# Patient Record
Sex: Female | Born: 1945 | Race: White | Hispanic: No | Marital: Married | Smoking: Never smoker
Health system: Southern US, Community
[De-identification: ages and names within clinical notes are randomized; demographics above are authoritative.]

## PROBLEM LIST (undated history)

## (undated) DIAGNOSIS — Z9889 Other specified postprocedural states: Secondary | ICD-10-CM

## (undated) DIAGNOSIS — E7800 Pure hypercholesterolemia, unspecified: Secondary | ICD-10-CM

## (undated) DIAGNOSIS — I10 Essential (primary) hypertension: Secondary | ICD-10-CM

## (undated) DIAGNOSIS — R112 Nausea with vomiting, unspecified: Secondary | ICD-10-CM

## (undated) HISTORY — PX: NECK SURGERY: SHX720

## (undated) HISTORY — PX: CHOLECYSTECTOMY: SHX55

## (undated) HISTORY — DX: Pure hypercholesterolemia, unspecified: E78.00

## (undated) HISTORY — PX: OTHER SURGICAL HISTORY: SHX169

## (undated) HISTORY — DX: Essential (primary) hypertension: I10

## (undated) HISTORY — PX: BACK SURGERY: SHX140

---

## 2018-07-01 ENCOUNTER — Encounter (INDEPENDENT_AMBULATORY_CARE_PROVIDER_SITE_OTHER): Payer: Self-pay | Admitting: Internal Medicine

## 2018-07-01 ENCOUNTER — Encounter (INDEPENDENT_AMBULATORY_CARE_PROVIDER_SITE_OTHER): Payer: Self-pay | Admitting: *Deleted

## 2018-07-01 ENCOUNTER — Telehealth (INDEPENDENT_AMBULATORY_CARE_PROVIDER_SITE_OTHER): Payer: Self-pay | Admitting: *Deleted

## 2018-07-01 ENCOUNTER — Ambulatory Visit (INDEPENDENT_AMBULATORY_CARE_PROVIDER_SITE_OTHER): Payer: Medicare Other | Admitting: Internal Medicine

## 2018-07-01 VITALS — BP 118/70 | HR 78 | Temp 98.0°F | Ht 64.0 in | Wt 136.3 lb

## 2018-07-01 DIAGNOSIS — K5732 Diverticulitis of large intestine without perforation or abscess without bleeding: Secondary | ICD-10-CM | POA: Insufficient documentation

## 2018-07-01 DIAGNOSIS — Z86010 Personal history of colonic polyps: Secondary | ICD-10-CM

## 2018-07-01 MED ORDER — PEG 3350-KCL-NA BICARB-NACL 420 G PO SOLR: 4000.0000 mL | Freq: Once | ORAL | 0 refills | Status: AC

## 2018-07-01 NOTE — Progress Notes (Addendum)
   Subjective:    Patient ID: Jeanette Rasmussen, female    DOB: 05-Jul-1945, 73 y.o.   MRN: [id number]  HPI Referred by Dr. Macarthur Critchley for hx  diverticulitis. She says she is here to schedule a colonoscopy.  Underwent a sigmoidcolectomy 05/24/2016 for diverticulitis.  She says she still has flares of diverticulitis every once in a while.  Her appetite is okay. No unintentional weight. Her BMs ar normal. No melena or BRRB.  GERD which is controlled with Protonix. Takes Gaviscon at night.   Colonoscopy 2014. H/o adenomatous polyps. Dr. West Carbo Normal rectal exam. Good prep. Two small polyps in sigmoid colon- hot biopsy polypectomy. Sigmoid diverticulosis, internal hemorrhoids. Cecum well visualized. No further  Polyps from rectum to cecum. Biopsy: inflamed hyperplastic polyp.   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 High cholesterol   . Hypertension       Allergies  Allergen Reactions  . Penicillins     Rash, itching.   . Sulfa Antibiotics     Rash,itching    Current Outpatient Medications on File Prior to Visit  Medication Sig Dispense Refill  . Ascorbic Acid (VITAMIN C) 1000 MG tablet Take 1,000 mg by mouth daily.    Marland Kitchen aspirin EC 81 MG tablet Take 81 mg by mouth daily.    . Cholecalciferol (VITAMIN D3) 125 MCG (5000 UT) CAPS Take by mouth.    . Glucosamine-Chondroit-Vit C-Mn (GLUCOSAMINE 1500 COMPLEX PO) Take by mouth.    Marland Kitchen lisinopril-hydrochlorothiazide (PRINZIDE,ZESTORETIC) 10-12.5 MG tablet Take 1 tablet by mouth daily.    Marland Kitchen loratadine (CLARITIN) 10 MG tablet Take 10 mg by mouth daily.    Marland Kitchen lovastatin (MEVACOR) 20 MG tablet Take 20 mg by mouth at bedtime.    . Multiple Vitamin (MULTIVITAMIN) tablet Take 1 tablet by mouth daily.    . NON FORMULARY Chondrotin 1200mg  daily.    . Omega-3 Fatty Acids (FISH OIL) 1200 MG CAPS Take by mouth.    . pantoprazole (PROTONIX) 40 MG tablet Take 40 mg by  mouth daily.     No current facility-administered medications on file prior to visit.         Objective:   Physical Exam Blood pressure 118/70, pulse 78, temperature 98 F (36.7 C), height 5\' 4"  (1.626 m), weight 136 lb 4.8 oz (61.8 kg).  Alert and oriented. Skin warm and dry. Oral mucosa is moist.   . Sclera anicteric, conjunctivae is pink. Thyroid not enlarged. No cervical lymphadenopathy. Lungs clear. Heart regular rate and rhythm.  Abdomen is soft. Bowel sounds are positive. No hepatomegaly. No abdominal masses felt. No tenderness.  No edema to lower extremities.          Assessment & Plan:  Hx of colon polyps and diverticulitis. Needs surveillance colonoscopy The risks of bleeding, perforation and infection were reviewed with patient.

## 2018-07-01 NOTE — Telephone Encounter (Signed)
Patient needs trilyte 

## 2018-07-01 NOTE — Patient Instructions (Addendum)
The risks of bleeding, perforation and infection were reviewed with patient. Try Pepcid at night before going to bed.

## 2018-10-28 ENCOUNTER — Encounter (INDEPENDENT_AMBULATORY_CARE_PROVIDER_SITE_OTHER): Payer: Self-pay | Admitting: *Deleted

## 2018-12-17 ENCOUNTER — Other Ambulatory Visit: Payer: Self-pay

## 2018-12-17 ENCOUNTER — Other Ambulatory Visit (HOSPITAL_COMMUNITY)
Admission: RE | Admit: 2018-12-17 | Discharge: 2018-12-17 | Disposition: A | Discharge status: Home / Self Care | Payer: Medicare Other | Source: Ambulatory Visit | Attending: Internal Medicine | Admitting: Internal Medicine

## 2018-12-17 DIAGNOSIS — Z20828 Contact with and (suspected) exposure to other viral communicable diseases: Secondary | ICD-10-CM | POA: Diagnosis not present

## 2018-12-17 DIAGNOSIS — Z01812 Encounter for preprocedural laboratory examination: Secondary | ICD-10-CM | POA: Diagnosis not present

## 2018-12-17 LAB — SARS CORONAVIRUS 2 (TAT 6-24 HRS): SARS Coronavirus 2: NEGATIVE

## 2018-12-19 ENCOUNTER — Encounter (HOSPITAL_COMMUNITY)
Admission: RE | Disposition: A | Discharge status: Home / Self Care | Payer: Self-pay | Source: Home / Self Care | Attending: Internal Medicine

## 2018-12-19 ENCOUNTER — Encounter (HOSPITAL_COMMUNITY): Payer: Self-pay | Admitting: *Deleted

## 2018-12-19 ENCOUNTER — Ambulatory Visit (HOSPITAL_COMMUNITY)
Admission: RE | Admit: 2018-12-19 | Discharge: 2018-12-19 | Disposition: A | Discharge status: Home / Self Care | Payer: Medicare Other | Attending: Internal Medicine | Admitting: Internal Medicine

## 2018-12-19 ENCOUNTER — Other Ambulatory Visit: Payer: Self-pay

## 2018-12-19 DIAGNOSIS — D122 Benign neoplasm of ascending colon: Secondary | ICD-10-CM | POA: Insufficient documentation

## 2018-12-19 DIAGNOSIS — D125 Benign neoplasm of sigmoid colon: Secondary | ICD-10-CM | POA: Insufficient documentation

## 2018-12-19 DIAGNOSIS — K5730 Diverticulosis of large intestine without perforation or abscess without bleeding: Secondary | ICD-10-CM

## 2018-12-19 DIAGNOSIS — Z1211 Encounter for screening for malignant neoplasm of colon: Secondary | ICD-10-CM | POA: Insufficient documentation

## 2018-12-19 DIAGNOSIS — D120 Benign neoplasm of cecum: Secondary | ICD-10-CM

## 2018-12-19 DIAGNOSIS — I10 Essential (primary) hypertension: Secondary | ICD-10-CM | POA: Insufficient documentation

## 2018-12-19 DIAGNOSIS — Z09 Encounter for follow-up examination after completed treatment for conditions other than malignant neoplasm: Secondary | ICD-10-CM | POA: Diagnosis not present

## 2018-12-19 DIAGNOSIS — Z79899 Other long term (current) drug therapy: Secondary | ICD-10-CM | POA: Insufficient documentation

## 2018-12-19 DIAGNOSIS — K644 Residual hemorrhoidal skin tags: Secondary | ICD-10-CM | POA: Insufficient documentation

## 2018-12-19 DIAGNOSIS — K6289 Other specified diseases of anus and rectum: Secondary | ICD-10-CM | POA: Diagnosis not present

## 2018-12-19 DIAGNOSIS — E7800 Pure hypercholesterolemia, unspecified: Secondary | ICD-10-CM | POA: Insufficient documentation

## 2018-12-19 DIAGNOSIS — Z86010 Personal history of colonic polyps: Secondary | ICD-10-CM

## 2018-12-19 DIAGNOSIS — Z7982 Long term (current) use of aspirin: Secondary | ICD-10-CM | POA: Diagnosis not present

## 2018-12-19 DIAGNOSIS — Z980 Intestinal bypass and anastomosis status: Secondary | ICD-10-CM | POA: Diagnosis not present

## 2018-12-19 DIAGNOSIS — K5732 Diverticulitis of large intestine without perforation or abscess without bleeding: Secondary | ICD-10-CM

## 2018-12-19 HISTORY — PX: COLONOSCOPY: SHX5424

## 2018-12-19 HISTORY — PX: BIOPSY: SHX5522

## 2018-12-19 SURGERY — COLONOSCOPY
Anesthesia: Moderate Sedation

## 2018-12-19 MED ORDER — MIDAZOLAM HCL 5 MG/5ML IJ SOLN
INTRAMUSCULAR | Status: DC | PRN
  Administered 2018-12-19: 1 mg via INTRAVENOUS
  Administered 2018-12-19 (×2): 2 mg via INTRAVENOUS
  Administered 2018-12-19: 1 mg via INTRAVENOUS

## 2018-12-19 MED ORDER — SODIUM CHLORIDE 0.9 % IV SOLN
INTRAVENOUS | Status: DC
  Administered 2018-12-19: 09:00:00 via INTRAVENOUS

## 2018-12-19 MED ORDER — MIDAZOLAM HCL 5 MG/5ML IJ SOLN
INTRAMUSCULAR | Status: AC
  Filled 2018-12-19: qty 10

## 2018-12-19 MED ORDER — STERILE WATER FOR IRRIGATION IR SOLN
Status: DC | PRN
  Administered 2018-12-19: 5 mL

## 2018-12-19 MED ORDER — MEPERIDINE HCL 50 MG/ML IJ SOLN
INTRAMUSCULAR | Status: DC | PRN
  Administered 2018-12-19 (×2): 25 mg

## 2018-12-19 MED ORDER — MEPERIDINE HCL 50 MG/ML IJ SOLN
INTRAMUSCULAR | Status: AC
  Filled 2018-12-19: qty 1

## 2018-12-19 NOTE — H&P (Signed)
Jeanette Rasmussen is an 73 y.o. female.   Chief Complaint: Patient is here for colonoscopy. HPI: Patient is 73 year old Caucasian female who has history of colonic adenomas and is here for surveillance colonoscopy.  Last colonoscopy was about 6 years ago.  She denies abdominal pain or rectal bleeding.  She is prone to constipation.  She also has history of recurrent diverticulitis and underwent sigmoid colon resection in January 2018.  She says she is to flareup since then responding to outpatient antibiotic therapy. Family history significant for CRC in 1 first cousin who was in his 41s at the time of diagnosis.  He is doing fine 15 years later.  Past Medical History:  Diagnosis Date  . High cholesterol   . Hypertension     Past Surgical History:  Procedure Laterality Date  . BACK SURGERY     x 2  . CHOLECYSTECTOMY    . complete hysterectomy    . hemicolectdomy     2018 Dr. Audrie Lia  . Left hernia      inguinal (Left)  . NECK SURGERY     x 3    History reviewed. No pertinent family history. Social History:  reports that she has never smoked. She has never used smokeless tobacco. She reports that she does not drink alcohol or use drugs.  Allergies:  Allergies  Allergen Reactions  . Penicillins     Rash, itching.  Did it involve swelling of the face/tongue/throat, SOB, or low BP? No Did it involve sudden or severe rash/hives, skin peeling, or any reaction on the inside of your mouth or nose? No Did you need to seek medical attention at a hospital or doctor's office? No When did it last happen?its been years  If all above answers are "NO", may proceed with cephalosporin use.   . Sulfa Antibiotics     Rash,itching    Medications Prior to Admission  Medication Sig Dispense Refill  . Ascorbic Acid (VITAMIN C) 1000 MG tablet Take 1,000 mg by mouth daily.    Marland Kitchen aspirin EC 81 MG tablet Take 81 mg by mouth daily.    . Cholecalciferol (VITAMIN D3) 125 MCG (5000 UT) CAPS Take  5,000 Units by mouth daily.     . Glucosamine-Chondroit-Vit C-Mn (GLUCOSAMINE 1500 COMPLEX PO) Take 1 tablet by mouth daily.     Marland Kitchen lisinopril-hydrochlorothiazide (PRINZIDE,ZESTORETIC) 10-12.5 MG tablet Take 1 tablet by mouth daily.    Marland Kitchen loratadine (CLARITIN) 10 MG tablet Take 10 mg by mouth daily as needed for allergies.     Marland Kitchen lovastatin (MEVACOR) 20 MG tablet Take 20 mg by mouth at bedtime.    . Multiple Vitamin (MULTIVITAMIN) tablet Take 1 tablet by mouth daily.    . Omega-3 Fatty Acids (FISH OIL) 1200 MG CAPS Take 1,200 mg by mouth daily.     . pantoprazole (PROTONIX) 40 MG tablet Take 40 mg by mouth daily.      No results found for this or any previous visit (from the past 48 hour(s)). No results found.  ROS  Blood pressure (!) 148/88, pulse 66, temperature 98.3 F (36.8 C), temperature source Oral, resp. rate 16, SpO2 99 %. Physical Exam  Constitutional: She appears well-developed and well-nourished.  HENT:  Mouth/Throat: Oropharynx is clear and moist.  Eyes: Conjunctivae are normal. No scleral icterus.  Neck: No thyromegaly present.  Cardiovascular: Normal rate, regular rhythm and normal heart sounds.  No murmur heard. Respiratory: Effort normal and breath sounds normal.  GI:  Abdomen is  flat with scarring left lower quadrant.  Abdomen is soft and nontender with organomegaly or masses.  Musculoskeletal:        General: No edema.  Lymphadenopathy:    She has no cervical adenopathy.  Neurological: She is alert.  Skin: Skin is warm and dry.     Assessment/Plan History of colonic adenomas. Surveillance colonoscopy.  Hildred Laser, MD 12/19/2018, 9:15 AM

## 2018-12-19 NOTE — Discharge Instructions (Signed)
No aspirin or NSAIDs for 24 hours. Resume other medications as before. High-fiber diet. May consider Metamucil 4 g by mouth daily at bedtime. No driving for 24 hours. Physician will call with biopsy results.   Colonoscopy, Adult, Care After This sheet gives you information about how to care for yourself after your procedure. Your health care provider may also give you more specific instructions. If you have problems or questions, contact your health care provider.  Dr Laural GoldenWB:[phone].  After hours and weekends call the hospital and have the GI doctor on call paged.  They will call you back. What can I expect after the procedure? After the procedure, it is common to have:  A small amount of blood in your stool for 24 hours after the procedure.  Some gas.  Mild abdominal cramping or bloating.   Follow these instructions at home: General instructions  For the first 24 hours after the procedure: ? Do not drive or use machinery. ? Do not sign important documents. ? Do not drink alcohol. ? Do your regular daily activities at a slower pace than normal.  Take over-the-counter or prescription medicines only as told by your health care provider.   Relieving cramping and bloating   Try walking around when you have cramps or feel bloated.  Eating and drinking   Drink enough fluid to keep your urine pale yellow.  Resume your normal diet as instructed by your health care provider.  Avoid drinking alcohol for as long as instructed by your health care provider.  Contact a health care provider if:  You have blood in your stool 2-3 days after the procedure. Get help right away if:  You have more than a small spotting of blood in your stool.  You pass large blood clots in your stool.  Your abdomen is swollen.  You have nausea or vomiting.  You have a fever.  You have increasing abdominal pain that is not relieved with medicine. Summary  After the procedure, it is  common to have a small amount of blood in your stool. You may also have mild abdominal cramping and bloating.  For the first 24 hours after the procedure, do not drive or use machinery, sign important documents, or drink alcohol.  Contact your health care provider if you have a lot of blood in your stool, nausea or vomiting, a fever, or increased abdominal pain. This information is not intended to replace advice given to you by your health care provider. Make sure you discuss any questions you have with your health care provider. Document Released: 11/23/2003 Document Revised: 01/31/2017 Document Reviewed: 06/22/2015 Elsevier Patient Education  [place].    High-Fiber Diet Fiber, also called dietary fiber, is a type of carbohydrate that is found in fruits, vegetables, whole grains, and beans. A high-fiber diet can have many health benefits. Your health care provider may recommend a high-fiber diet to help:  Prevent constipation. Fiber can make your bowel movements more regular.  Lower your cholesterol.  Relieve the following conditions: ? Swelling of veins in the anus (hemorrhoids). ? Swelling and irritation (inflammation) of specific areas of the digestive tract (uncomplicated diverticulosis). ? A problem of the large intestine (colon) that sometimes causes pain and diarrhea (irritable bowel syndrome, IBS).  Prevent overeating as part of a weight-loss plan.  Prevent heart disease, type 2 diabetes, and certain cancers. What is my plan? The recommended daily fiber intake in grams (g) includes:  38 g for men age 34  or younger.  30 g for men over age 107.  57 g for women age 60 or younger.  21 g for women over age 30. You can get the recommended daily intake of dietary fiber by:  Eating a variety of fruits, vegetables, grains, and beans.  Taking a fiber supplement, if it is not possible to get enough fiber through your diet. What do I need to know about a high-fiber  diet?  It is better to get fiber through food sources rather than from fiber supplements. There is not a lot of research about how effective supplements are.  Always check the fiber content on the nutrition facts label of any prepackaged food. Look for foods that contain 5 g of fiber or more per serving.  Talk with a diet and nutrition specialist (dietitian) if you have questions about specific foods that are recommended or not recommended for your medical condition, especially if those foods are not listed below.  Gradually increase how much fiber you consume. If you increase your intake of dietary fiber too quickly, you may have bloating, cramping, or gas.  Drink plenty of water. Water helps you to digest fiber. What are tips for following this plan?  Eat a wide variety of high-fiber foods.  Make sure that half of the grains that you eat each day are whole grains.  Eat breads and cereals that are made with whole-grain flour instead of refined flour or white flour.  Eat brown rice, bulgur wheat, or millet instead of white rice.  Start the day with a breakfast that is high in fiber, such as a cereal that contains 5 g of fiber or more per serving.  Use beans in place of meat in soups, salads, and pasta dishes.  Eat high-fiber snacks, such as berries, raw vegetables, nuts, and popcorn.  Choose whole fruits and vegetables instead of processed forms like juice or sauce. What foods can I eat?  Fruits Berries. Pears. Apples. Oranges. Avocado. Prunes and raisins. Dried figs. Vegetables Sweet potatoes. Spinach. Kale. Artichokes. Cabbage. Broccoli. Cauliflower. Green peas. Carrots. Squash. Grains Whole-grain breads. Multigrain cereal. Oats and oatmeal. Brown rice. Barley. Bulgur wheat. [place]. Quinoa. Bran muffins. Popcorn. Rye wafer crackers. Meats and other proteins Navy, kidney, and pinto beans. Soybeans. Split peas. Lentils. Nuts and seeds. Dairy Fiber-fortified  yogurt. Beverages Fiber-fortified soy milk. Fiber-fortified orange juice. Other foods Fiber bars. The items listed above may not be a complete list of recommended foods and beverages. Contact a dietitian for more options. What foods are not recommended? Fruits Fruit juice. Cooked, strained fruit. Vegetables Fried potatoes. Canned vegetables. Well-cooked vegetables. Grains White bread. Pasta made with refined flour. White rice. Meats and other proteins Fatty cuts of meat. Fried chicken or fried fish. Dairy Milk. Yogurt. Cream cheese. Sour cream. Fats and oils Butters. Beverages Soft drinks. Other foods Cakes and pastries. The items listed above may not be a complete list of foods and beverages to avoid. Contact a dietitian for more information. Summary  Fiber is a type of carbohydrate. It is found in fruits, vegetables, whole grains, and beans.  There are many health benefits of eating a high-fiber diet, such as preventing constipation, lowering blood cholesterol, helping with weight loss, and reducing your risk of heart disease, diabetes, and certain cancers.  Gradually increase your intake of fiber. Increasing too fast can result in cramping, bloating, and gas. Drink plenty of water while you increase your fiber.  The best sources of fiber include whole fruits and vegetables,  whole grains, nuts, seeds, and beans. This information is not intended to replace advice given to you by your health care provider. Make sure you discuss any questions you have with your health care provider. Document Released: 04/10/2005 Document Revised: 02/12/2017 Document Reviewed: 02/12/2017 Elsevier Patient Education  2020 [employer].

## 2018-12-19 NOTE — Op Note (Signed)
[facility] Patient Name: Jeanette Rasmussen Procedure Date: 12/19/2018 9:10 AM MRN: [id number] Date of Birth: 1946/04/16 Attending MD: Hildred Laser , MD CSN: [id number] Age: 73 Admit Type: Outpatient Procedure:                Colonoscopy Indications:              High risk colon cancer surveillance: Personal                            history of colonic polyps Providers:                Hildred Laser, MD, Janeece Riggers, RN, Raphael Gibney,                            Technician Referring MD:             Earney Mallet, MD Medicines:                Meperidine 50 mg IV, Midazolam 6 mg IV Complications:            No immediate complications. Estimated Blood Loss:     Estimated blood loss was minimal. Procedure:                Pre-Anesthesia Assessment:                           - Prior to the procedure, a History and Physical                            was performed, and patient medications and                            allergies were reviewed. The patient's tolerance of                            previous anesthesia was also reviewed. The risks                            and benefits of the procedure and the sedation                            options and risks were discussed with the patient.                            All questions were answered, and informed consent                            was obtained. Prior Anticoagulants: The patient has                            taken no previous anticoagulant or antiplatelet                            agents except for aspirin. ASA Grade Assessment: II                            -  A patient with mild systemic disease. After                            reviewing the risks and benefits, the patient was                            deemed in satisfactory condition to undergo the                            procedure.                           After obtaining informed consent, the colonoscope                            was passed under direct  vision. Throughout the                            procedure, the patient's blood pressure, pulse, and                            oxygen saturations were monitored continuously. The                            [id number]) scope was introduced through                            the anus and advanced to the the cecum, identified                            by appendiceal orifice and ileocecal valve. The                            colonoscopy was performed without difficulty. The                            patient tolerated the procedure well. The quality                            of the bowel preparation was excellent. The                            ileocecal valve, appendiceal orifice, and rectum                            were photographed. Scope In: 9:25:14 AM Scope Out: 9:44:54 AM Scope Withdrawal Time: 0 hours 14 minutes 18 seconds  Total Procedure Duration: 0 hours 19 minutes 40 seconds  Findings:      The perianal and digital rectal examinations were normal.      Three sessile polyps were found in the proximal sigmoid colon, ascending       colon and cecum. The polyps were small in size. These were biopsied with       a cold forceps for histology. The pathology specimen was placed into  Bottle Number 1.      A few diverticula were found in the proximal sigmoid colon and hepatic       flexure.      There was evidence of a prior end-to-end colo-rectal anastomosis at 18       cm proximal to the anus. This was patent and was characterized by       healthy appearing mucosa.      External hemorrhoids were found during retroflexion. The hemorrhoids       were small.      Anal papilla(e) were hypertrophied. Impression:               - Three small polyps in the proximal sigmoid colon,                            in the ascending colon and in the cecum. Biopsied.                           - Diverticulosis in the proximal sigmoid colon and                            at the  hepatic flexure.                           - Patent end-to-end colo-rectal anastomosis,                            characterized by healthy appearing mucosa.                           - External hemorrhoids.                           - Anal papilla(e) were hypertrophied. Moderate Sedation:      Moderate (conscious) sedation was administered by the endoscopy nurse       and supervised by the endoscopist. The following parameters were       monitored: oxygen saturation, heart rate, blood pressure, CO2       capnography and response to care. Total physician intraservice time was       29 minutes. Recommendation:           - Patient has a contact number available for                            emergencies. The signs and symptoms of potential                            delayed complications were discussed with the                            patient. Return to normal activities tomorrow.                            Written discharge instructions were provided to the                            patient.                           -  High fiber diet today.                           - Continue present medications.                           - No aspirin, ibuprofen, naproxen, or other                            non-steroidal anti-inflammatory drugs for 1 day.                           - Await pathology results.                           - Repeat colonoscopy is recommended. The                            colonoscopy date will be determined after pathology                            results from today's exam become available for                            review. Procedure Code(s):        --- Professional ---                           [phone], Colonoscopy, flexible; with biopsy, single                            or multiple                           99153, Moderate sedation; each additional 15                            minutes intraservice time                           G0500, Moderate sedation services  provided by the                            same physician or other qualified health care                            professional performing a gastrointestinal                            endoscopic service that sedation supports,                            requiring the presence of an independent trained                            observer to assist in the monitoring of the  patient's level of consciousness and physiological                            status; initial 15 minutes of intra-service time;                            patient age 71 years or older (additional time may                            be reported with [phone], as appropriate) Diagnosis Code(s):        --- Professional ---                           Z86.010, Personal history of colonic polyps                           K63.5, Polyp of colon                           K64.4, Residual hemorrhoidal skin tags                           Z98.0, Intestinal bypass and anastomosis status                           K62.89, Other specified diseases of anus and rectum                           K57.30, Diverticulosis of large intestine without                            perforation or abscess without bleeding CPT copyright 2019 American Medical Association. All rights reserved. The codes documented in this report are preliminary and upon coder review may  be revised to meet current compliance requirements. Hildred Laser, MD Hildred Laser, MD 12/19/2018 9:58:06 AM This report has been signed electronically. Number of Addenda: 0

## 2018-12-23 ENCOUNTER — Encounter (HOSPITAL_COMMUNITY): Payer: Self-pay | Admitting: Internal Medicine

## 2019-07-22 ENCOUNTER — Encounter (INDEPENDENT_AMBULATORY_CARE_PROVIDER_SITE_OTHER): Payer: Self-pay | Admitting: Internal Medicine

## 2019-07-22 ENCOUNTER — Other Ambulatory Visit: Payer: Self-pay

## 2019-07-22 ENCOUNTER — Ambulatory Visit (INDEPENDENT_AMBULATORY_CARE_PROVIDER_SITE_OTHER): Payer: Medicare Other | Admitting: Internal Medicine

## 2019-07-22 DIAGNOSIS — K219 Gastro-esophageal reflux disease without esophagitis: Secondary | ICD-10-CM | POA: Insufficient documentation

## 2019-07-22 DIAGNOSIS — R1013 Epigastric pain: Secondary | ICD-10-CM | POA: Insufficient documentation

## 2019-07-22 MED ORDER — ESOMEPRAZOLE MAGNESIUM 40 MG PO CPDR: 40.0000 mg | DELAYED_RELEASE_CAPSULE | Freq: Every day | ORAL | 5 refills | Status: DC

## 2019-07-22 NOTE — Patient Instructions (Signed)
Discontinue pantoprazole. Begin Nexium/esomeprazole 40 mg by mouth 30 minutes before breakfast daily. Will request copy of recent blood work and prior EGD reports were reviewed.

## 2019-07-22 NOTE — Progress Notes (Signed)
Presenting complaint;  Persistent symptoms of GERD despite taking medications.  Database and subjective:  Patient is 74 year old Caucasian female who presents for evaluation of GERD symptoms not responding to therapy anymore. She has history of diverticulitis with prior sigmoid colon resection as well as history of colonic polyps.  Her last colonoscopy was in August 2020 revealing 3 small polyps which were tubular adenomas and she also had few diverticula proximal to colonic anastomosis as well as at hepatic flexure.  Patient gives over 20-year history of GERD.  She reports last EGD by Dr. West Carbo was in 2014 and if she remembers correctly she had small sliding hiatal hernia.  She states she has had 2 or 3 EGDs prior to that as well.  She has tried Prilosec in the past but it did not work.  She has been on Protonix for several years and it worked very well until few months ago.  Dr. Macarthur Critchley advised her to take double dose which she did for 2 weeks and did not see any difference.  She has daily heartburn particularly if she stoops forward.  She eats her supper between 5 and 5:30 PM and goes to bed at 10:30 PM.  She has heartburn or regurgitation virtually every night.  She wakes up in the middle of night with burning in her chest.  She has elevated head end of bed.  She denies nausea vomiting or dysphagia.  She says when she was doing well she would take the medication and wait a while before she would eat breakfast and now she has to eat her breakfast as soon as she wakes up.  If she does not she gets heartburn and nausea.  She also complains of lower abdominal pain which she has had for years since she has had problems with diverticulitis.  In addition to this pain she also complains of epigastric pain which she has had off-and-on for the past 6 months and is more pronounced after meals.  She does not take any NSAIDs.  She has never been diagnosed with peptic ulcer disease.  She is watching her diet.   She may have 1 can of Coke and 1 cup of coffee a day. Her bowels move daily as long as she takes polyethylene glycol.  She denies melena or rectal bleeding.  She does not smoke cigarettes and drinks wine occasionally. She takes Gaviscon at bedtime which is not listed below.  She states she tried famotidine but it did not work.  She feels Gaviscon has helped.  Current Medications: Outpatient Encounter Medications as of 07/22/2019  Medication Sig  . Ascorbic Acid (VITAMIN C) 1000 MG tablet Take 1,000 mg by mouth daily.  Marland Kitchen aspirin EC 81 MG tablet Take 1 tablet (81 mg total) by mouth daily.  . Cholecalciferol (VITAMIN D3) 125 MCG (5000 UT) CAPS Take 5,000 Units by mouth daily.   . Glucosamine-Chondroit-Vit C-Mn (GLUCOSAMINE 1500 COMPLEX PO) Take 1 tablet by mouth daily.   Marland Kitchen lisinopril-hydrochlorothiazide (PRINZIDE,ZESTORETIC) 10-12.5 MG tablet Take 1 tablet by mouth daily.  Marland Kitchen loratadine (CLARITIN) 10 MG tablet Take 10 mg by mouth daily as needed for allergies.   Marland Kitchen lovastatin (MEVACOR) 20 MG tablet Take 20 mg by mouth at bedtime.  . Multiple Vitamin (MULTIVITAMIN) tablet Take 1 tablet by mouth daily.  . Omega-3 Fatty Acids (FISH OIL) 1200 MG CAPS Take 1,200 mg by mouth daily.   . pantoprazole (PROTONIX) 40 MG tablet Take 40 mg by mouth daily.  . polyethylene glycol (MIRALAX /  GLYCOLAX) 17 g packet Take 17 g by mouth daily. Patient states that she takes 1 teaspoon daily.   No facility-administered encounter medications on file as of 07/22/2019.    Objective: Blood pressure 133/86, pulse 60, temperature (!) 97.2 F (36.2 C), temperature source Temporal, height 5\' 4"  (1.626 m), weight 134 lb 4.8 oz (60.9 kg). Patient is alert and in no acute distress. She is wearing a facial mask. Conjunctiva is pink. Sclera is nonicteric Oropharyngeal mucosa is normal. No neck masses or thyromegaly noted. She has 2 neck scars anteriorly to the right of midline lower scar is just above medial end of  clavicle. She also has a long midline posterior scar over lower neck. Cardiac exam with regular rhythm normal S1 and S2. No murmur or gallop noted. Lungs are clear to auscultation. Abdomen is symmetrical.  Bowel sounds are normal.  She has lower midline and Pfannenstiel scars.  On palpation abdomen is soft.  She has mild midepigastric tenderness.  No organomegaly or masses. No LE edema or clubbing noted.  Labs/studies Results: No recent lab data reviewed.  Assessment:  #1.  Chronic GERD.  She has not changed her eating habits and not taking any new medications.  Pantoprazole is not working anymore.  It is possible that she just has developed tachyphylaxis.  She has undergone multiple EGDs in the past and never documented to have Barrett's esophagus.  She has history of small sliding hiatal hernia and one has to wonder if hernia has gotten large or if she has developed other conditions such as peptic ulcer disease or gastroparesis.  She is status post cholecystectomy.  #2.  Epigastric pain.  No history of peptic ulcer disease.  She may need an EGD.  #3.  History of colonic adenomas.  She will not be due for colonoscopy until August 2025.   Plan:  Discontinue pantoprazole. Continue Gaviscon at bedtime as before. Esomeprazole 40 mg by mouth 30 minutes before breakfast daily. Will request copy of blood work from Dr. Karna Christmas office before any tests ordered. Office visit in 3 months.

## 2019-07-29 ENCOUNTER — Other Ambulatory Visit (INDEPENDENT_AMBULATORY_CARE_PROVIDER_SITE_OTHER): Payer: Self-pay | Admitting: *Deleted

## 2019-07-29 ENCOUNTER — Encounter (INDEPENDENT_AMBULATORY_CARE_PROVIDER_SITE_OTHER): Payer: Self-pay | Admitting: *Deleted

## 2019-07-29 DIAGNOSIS — R1013 Epigastric pain: Secondary | ICD-10-CM

## 2019-07-29 DIAGNOSIS — K219 Gastro-esophageal reflux disease without esophagitis: Secondary | ICD-10-CM

## 2019-08-25 ENCOUNTER — Other Ambulatory Visit: Payer: Self-pay

## 2019-08-25 ENCOUNTER — Other Ambulatory Visit (HOSPITAL_COMMUNITY)
Admission: RE | Admit: 2019-08-25 | Discharge: 2019-08-25 | Disposition: A | Discharge status: Home / Self Care | Payer: Medicare Other | Source: Ambulatory Visit | Attending: Internal Medicine | Admitting: Internal Medicine

## 2019-08-25 DIAGNOSIS — Z20822 Contact with and (suspected) exposure to covid-19: Secondary | ICD-10-CM | POA: Insufficient documentation

## 2019-08-25 DIAGNOSIS — Z01812 Encounter for preprocedural laboratory examination: Secondary | ICD-10-CM | POA: Diagnosis present

## 2019-08-25 LAB — SARS CORONAVIRUS 2 (TAT 6-24 HRS): SARS Coronavirus 2: NEGATIVE

## 2019-08-27 ENCOUNTER — Ambulatory Visit (HOSPITAL_COMMUNITY)
Admission: RE | Admit: 2019-08-27 | Discharge: 2019-08-27 | Disposition: A | Discharge status: Home / Self Care | Payer: Medicare Other | Attending: Internal Medicine | Admitting: Internal Medicine

## 2019-08-27 ENCOUNTER — Other Ambulatory Visit: Payer: Self-pay

## 2019-08-27 ENCOUNTER — Encounter (HOSPITAL_COMMUNITY)
Admission: RE | Disposition: A | Discharge status: Home / Self Care | Payer: Self-pay | Source: Home / Self Care | Attending: Internal Medicine

## 2019-08-27 ENCOUNTER — Encounter (HOSPITAL_COMMUNITY): Payer: Self-pay | Admitting: Internal Medicine

## 2019-08-27 DIAGNOSIS — K317 Polyp of stomach and duodenum: Secondary | ICD-10-CM

## 2019-08-27 DIAGNOSIS — Z882 Allergy status to sulfonamides status: Secondary | ICD-10-CM | POA: Diagnosis not present

## 2019-08-27 DIAGNOSIS — K219 Gastro-esophageal reflux disease without esophagitis: Secondary | ICD-10-CM

## 2019-08-27 DIAGNOSIS — K2970 Gastritis, unspecified, without bleeding: Secondary | ICD-10-CM | POA: Insufficient documentation

## 2019-08-27 DIAGNOSIS — I10 Essential (primary) hypertension: Secondary | ICD-10-CM | POA: Diagnosis not present

## 2019-08-27 DIAGNOSIS — Z800 Family history of malignant neoplasm of digestive organs: Secondary | ICD-10-CM | POA: Insufficient documentation

## 2019-08-27 DIAGNOSIS — Z7982 Long term (current) use of aspirin: Secondary | ICD-10-CM | POA: Insufficient documentation

## 2019-08-27 DIAGNOSIS — Z880 Allergy status to penicillin: Secondary | ICD-10-CM | POA: Diagnosis not present

## 2019-08-27 DIAGNOSIS — K3189 Other diseases of stomach and duodenum: Secondary | ICD-10-CM

## 2019-08-27 DIAGNOSIS — Z79899 Other long term (current) drug therapy: Secondary | ICD-10-CM | POA: Diagnosis not present

## 2019-08-27 DIAGNOSIS — R1013 Epigastric pain: Secondary | ICD-10-CM | POA: Diagnosis not present

## 2019-08-27 DIAGNOSIS — Z90710 Acquired absence of both cervix and uterus: Secondary | ICD-10-CM | POA: Diagnosis not present

## 2019-08-27 DIAGNOSIS — Z9049 Acquired absence of other specified parts of digestive tract: Secondary | ICD-10-CM | POA: Diagnosis not present

## 2019-08-27 DIAGNOSIS — Z86010 Personal history of colonic polyps: Secondary | ICD-10-CM | POA: Insufficient documentation

## 2019-08-27 DIAGNOSIS — E7800 Pure hypercholesterolemia, unspecified: Secondary | ICD-10-CM | POA: Insufficient documentation

## 2019-08-27 DIAGNOSIS — K449 Diaphragmatic hernia without obstruction or gangrene: Secondary | ICD-10-CM | POA: Diagnosis not present

## 2019-08-27 HISTORY — PX: BIOPSY: SHX5522

## 2019-08-27 HISTORY — PX: POLYPECTOMY: SHX5525

## 2019-08-27 HISTORY — PX: ESOPHAGOGASTRODUODENOSCOPY: SHX5428

## 2019-08-27 HISTORY — DX: Other specified postprocedural states: Z98.890

## 2019-08-27 HISTORY — DX: Nausea with vomiting, unspecified: R11.2

## 2019-08-27 SURGERY — EGD (ESOPHAGOGASTRODUODENOSCOPY)
Anesthesia: Moderate Sedation

## 2019-08-27 MED ORDER — STERILE WATER FOR IRRIGATION IR SOLN
Status: DC | PRN
  Administered 2019-08-27: 1.5 mL

## 2019-08-27 MED ORDER — ONDANSETRON 4 MG PO TBDP
4.0000 mg | ORAL_TABLET | Freq: Once | ORAL | Status: AC
  Administered 2019-08-27: 4 mg via ORAL

## 2019-08-27 MED ORDER — SODIUM CHLORIDE 0.9 % IV SOLN
INTRAVENOUS | Status: DC
  Administered 2019-08-27: 1000 mL via INTRAVENOUS

## 2019-08-27 MED ORDER — ONDANSETRON 4 MG PO TBDP
ORAL_TABLET | ORAL | Status: AC
  Filled 2019-08-27: qty 1

## 2019-08-27 MED ORDER — MEPERIDINE HCL 50 MG/ML IJ SOLN
INTRAMUSCULAR | Status: DC | PRN
  Administered 2019-08-27 (×2): 25 mg via INTRAVENOUS

## 2019-08-27 MED ORDER — LIDOCAINE VISCOUS HCL 2 % MT SOLN
OROMUCOSAL | Status: DC | PRN
  Administered 2019-08-27: 4 mL via OROMUCOSAL

## 2019-08-27 MED ORDER — LIDOCAINE VISCOUS HCL 2 % MT SOLN
OROMUCOSAL | Status: AC
  Filled 2019-08-27: qty 15

## 2019-08-27 MED ORDER — MEPERIDINE HCL 50 MG/ML IJ SOLN
INTRAMUSCULAR | Status: AC
  Filled 2019-08-27: qty 1

## 2019-08-27 MED ORDER — MIDAZOLAM HCL 5 MG/5ML IJ SOLN
INTRAMUSCULAR | Status: DC | PRN
  Administered 2019-08-27 (×2): 2 mg via INTRAVENOUS
  Administered 2019-08-27: 1 mg via INTRAVENOUS

## 2019-08-27 MED ORDER — MIDAZOLAM HCL 5 MG/5ML IJ SOLN
INTRAMUSCULAR | Status: AC
  Filled 2019-08-27: qty 10

## 2019-08-27 NOTE — H&P (Signed)
Jeanette Rasmussen is an 74 y.o. female.   Chief Complaint: Patient is here for esophagogastroduodenoscopy. HPI: Patient is 74 year old Caucasian female with chronic GERD was not responding therapy.  She had been on pantoprazole.  She was seen in the office about 9 weeks ago and switched to esomeprazole.  She says she still having heartburn at least 2 or 3 times per week usually at night.  She continues to experience postprandial epigastric pain.  As a result she has been eating less.  She has as noted nausea but no vomiting.  She denies melena or rectal bleeding or weight loss.  Past Medical History:  Diagnosis Date  . High cholesterol   . Hypertension   . PONV (postoperative nausea and vomiting)     Past Surgical History:  Procedure Laterality Date  . BACK SURGERY     x 2  . BIOPSY  12/19/2018   Procedure: BIOPSY;  Surgeon: Rogene Houston, MD;  Location: [facility];  Service: Endoscopy;;  colon polyp   . CHOLECYSTECTOMY    . COLONOSCOPY N/A 12/19/2018   Procedure: COLONOSCOPY;  Surgeon: Rogene Houston, MD;  Location: [facility];  Service: Endoscopy;  Laterality: N/A;  1:00  . complete hysterectomy    . hemicolectdomy     2018 Dr. Audrie Lia  . Left hernia      inguinal (Left)  . NECK SURGERY     x 3    Family History  Problem Relation Age of Onset  . Stomach cancer Maternal Grandfather    Social History:  reports that she has never smoked. She has never used smokeless tobacco. She reports current alcohol use. She reports that she does not use drugs.  Allergies:  Allergies  Allergen Reactions  . Penicillins     Rash, itching.  Did it involve swelling of the face/tongue/throat, SOB, or low BP? No Did it involve sudden or severe rash/hives, skin peeling, or any reaction on the inside of your mouth or nose? No Did you need to seek medical attention at a hospital or doctor's office? No When did it last happen?its been years  If all above answers are "NO", may  proceed with cephalosporin use.   . Sulfa Antibiotics     Rash,itching    Medications Prior to Admission  Medication Sig Dispense Refill  . Alum Hydroxide-Mag Trisilicate (GAVISCON) [id number] MG CHEW Chew 1 tablet by mouth at bedtime.    . Ascorbic Acid (VITAMIN C) 1000 MG tablet Take 1,000 mg by mouth daily.    Marland Kitchen aspirin EC 81 MG tablet Take 1 tablet (81 mg total) by mouth daily.    . Cholecalciferol (VITAMIN D3) 125 MCG (5000 UT) CAPS Take 5,000 Units by mouth daily.     Marland Kitchen esomeprazole (NEXIUM) 40 MG capsule Take 1 capsule (40 mg total) by mouth daily before breakfast. 30 capsule 5  . Glucosamine-Chondroit-Vit C-Mn (GLUCOSAMINE 1500 COMPLEX PO) Take 1 tablet by mouth daily.     Marland Kitchen lisinopril-hydrochlorothiazide (PRINZIDE,ZESTORETIC) 10-12.5 MG tablet Take 1 tablet by mouth daily.    Marland Kitchen loratadine (CLARITIN) 10 MG tablet Take 10 mg by mouth daily as needed for allergies.     Marland Kitchen lovastatin (MEVACOR) 20 MG tablet Take 20 mg by mouth at bedtime.    . Multiple Vitamin (MULTIVITAMIN) tablet Take 1 tablet by mouth daily.    . Omega-3 Fatty Acids (FISH OIL) 1200 MG CAPS Take 1,200 mg by mouth daily.     . polyethylene glycol (MIRALAX /  GLYCOLAX) 17 g packet Take 17 g by mouth daily. Patient states that she takes 1 teaspoon daily.      No results found for this or any previous visit (from the past 48 hour(s)). No results found.  Review of Systems  Blood pressure 136/84, pulse 78, temperature 97.9 F (36.6 C), temperature source Oral, resp. rate 16, height 5\' 4"  (1.626 m), weight 60.8 kg, SpO2 100 %. Physical Exam  Constitutional: She appears well-developed and well-nourished.  HENT:  Mouth/Throat: Oropharynx is clear and moist.  Eyes: Conjunctivae are normal. No scleral icterus.  Neck: No thyromegaly present.  Cardiovascular: Normal rate, regular rhythm and normal heart sounds.  No murmur heard. Respiratory: Effort normal and breath sounds normal.  GI:  Pfannenstiel scar.  Abdomen is soft  with mild midepigastric tenderness.  No organomegaly or masses.  Lymphadenopathy:    She has no cervical adenopathy.  Neurological: She is alert.  Skin: Skin is warm and dry.     Assessment/Plan GERD refractory to therapy. Epigastric pain. Diagnostic esophagogastroduodenoscopy.  Hildred Laser, MD 08/27/2019, 12:58 PM

## 2019-08-27 NOTE — Op Note (Signed)
[facility] Patient Name: Jeanette Rasmussen Procedure Date: 08/27/2019 12:10 PM MRN: [id number] Date of Birth: Aug 19, 1945 Attending MD: Hildred Laser , MD CSN: [id number] Age: 74 Admit Type: Outpatient Procedure:                Upper GI endoscopy Indications:              Epigastric abdominal pain, Follow-up of                            gastro-esophageal reflux disease Providers:                Hildred Laser, MD, Otis Peak B. Sharon Seller, RN, Nelma Rothman, Technician Referring MD:             Earney Mallet, MD Medicines:                Lidocaine jelly, Meperidine 50 mg IV, Midazolam 5                            mg IV Complications:            No immediate complications. Estimated Blood Loss:     Estimated blood loss was minimal. Procedure:                Pre-Anesthesia Assessment:                           - Prior to the procedure, a History and Physical                            was performed, and patient medications and                            allergies were reviewed. The patient's tolerance of                            previous anesthesia was also reviewed. The risks                            and benefits of the procedure and the sedation                            options and risks were discussed with the patient.                            All questions were answered, and informed consent                            was obtained. Prior Anticoagulants: The patient has                            taken no previous anticoagulant or antiplatelet  agents except for aspirin. ASA Grade Assessment: II                            - A patient with mild systemic disease. After                            reviewing the risks and benefits, the patient was                            deemed in satisfactory condition to undergo the                            procedure.                           After obtaining informed consent, the endoscope  was                            passed under direct vision. Throughout the                            procedure, the patient's blood pressure, pulse, and                            oxygen saturations were monitored continuously. The                            [id number]) scope was introduced through the                            mouth, and advanced to the second part of duodenum.                            The upper GI endoscopy was accomplished without                            difficulty. The patient tolerated the procedure                            well. Scope In: 1:08:44 PM Scope Out: 1:18:13 PM Total Procedure Duration: 0 hours 9 minutes 29 seconds  Findings:      The hypopharynx was normal.      The examined esophagus was normal.      The Z-line was regular and was found 35 cm from the incisors.      A 3 cm hiatal hernia was present.      A few small sessile polyps with no stigmata of recent bleeding were       found in the gastric fundus and in the gastric body. Biopsies were taken       from four with a cold forceps for histology. The pathology specimen was       placed into Bottle Number 2.      Patchy mild inflammation characterized by congestion (edema), erythema       and granularity was found in the gastric antrum and at  the pylorus.       Biopsies were taken with a cold forceps for histology. The pathology       specimen was placed into Bottle Number 1.      A small healed ulcer was found in the gastric antrum.      The duodenal bulb and second portion of the duodenum were normal. Impression:               - Normal hypopharynx.                           - Normal esophagus.                           - Z-line regular, 35 cm from the incisors.                           - 3 cm hiatal hernia.                           - A few gastric polyps. Biopsied.                           - Gastritis. Biopsied.                           - Scar in the gastric antrum.                            - Normal duodenal bulb and second portion of the                            duodenum. Moderate Sedation:      Moderate (conscious) sedation was administered by the endoscopy nurse       and supervised by the endoscopist. The following parameters were       monitored: oxygen saturation, heart rate, blood pressure, CO2       capnography and response to care. Total physician intraservice time was       15 minutes. Recommendation:           - Patient has a contact number available for                            emergencies. The signs and symptoms of potential                            delayed complications were discussed with the                            patient. Return to normal activities tomorrow.                            Written discharge instructions were provided to the                            patient.                           -  Resume previous diet today.                           - Continue present medications.                           - No aspirin, ibuprofen, naproxen, or other                            non-steroidal anti-inflammatory drugs for 1 day.                           - Await pathology results. Procedure Code(s):        --- Professional ---                           [phone], Esophagogastroduodenoscopy, flexible,                            transoral; with biopsy, single or multiple                           G0500, Moderate sedation services provided by the                            same physician or other qualified health care                            professional performing a gastrointestinal                            endoscopic service that sedation supports,                            requiring the presence of an independent trained                            observer to assist in the monitoring of the                            patient's level of consciousness and physiological                            status; initial 15 minutes of  intra-service time;                            patient age 46 years or older (additional time may                            be reported with [phone], as appropriate) Diagnosis Code(s):        --- Professional ---                           K44.9, Diaphragmatic hernia without obstruction or  gangrene                           K31.7, Polyp of stomach and duodenum                           K29.70, Gastritis, unspecified, without bleeding                           K31.89, Other diseases of stomach and duodenum                           R10.13, Epigastric pain                           K21.9, Gastro-esophageal reflux disease without                            esophagitis CPT copyright 2019 [employer]. All rights reserved. The codes documented in this report are preliminary and upon coder review may  be revised to meet current compliance requirements. Hildred Laser, MD Hildred Laser, MD 08/27/2019 1:29:21 PM This report has been signed electronically. Number of Addenda: 0

## 2019-08-27 NOTE — Discharge Instructions (Signed)
No aspirin or NSAIDs for 24 hours. Resume other medications as before. Resume usual diet. No driving for 24 hours.  Physician will call with biopsy results and further recommendations.   Upper Endoscopy, Adult, Care After This sheet gives you information about how to care for yourself after your procedure. Your health care provider may also give you more specific instructions. If you have problems or questions, contact your health care provider. What can I expect after the procedure? After the procedure, it is common to have:  A sore throat.  Mild stomach pain or discomfort.  Bloating.  Nausea. Follow these instructions at home:   Follow instructions from your health care provider about what to eat or drink after your procedure.  Return to your normal activities as told by your health care provider. Ask your health care provider what activities are safe for you.  Take over-the-counter and prescription medicines only as told by your health care provider.  Do not drive for 24 hours if you were given a sedative during your procedure.  Keep all follow-up visits as told by your health care provider. This is important. Contact a health care provider if you have:  A sore throat that lasts longer than one day.  Trouble swallowing. Get help right away if:  You vomit blood or your vomit looks like coffee grounds.  You have: ? A fever. ? Bloody, black, or tarry stools. ? A severe sore throat or you cannot swallow. ? Difficulty breathing. ? Severe pain in your chest or abdomen. Summary  After the procedure, it is common to have a sore throat, mild stomach discomfort, bloating, and nausea.  Do not drive for 24 hours if you were given a sedative during the procedure.  Follow instructions from your health care provider about what to eat or drink after your procedure.  Return to your normal activities as told by your health care provider. This information is not intended to  replace advice given to you by your health care provider. Make sure you discuss any questions you have with your health care provider. Document Revised: 10/02/2017 Document Reviewed: 09/10/2017 Elsevier Patient Education  [place].   Gastric Polyps A gastric polyp, also called a stomach polyp, is a growth on the lining of the stomach. Most polyps are not dangerous, but some can be harmful because of their size, location, or type. Polyps that can become harmful include:  Large polyps. These can turn into sores (ulcers). Ulcers can lead to stomach bleeding.  Polyps that block food from moving from the stomach to the small intestine (gastric outlet obstruction).  A type of polyp called an adenoma. This type of polyp can become cancerous. What are the causes? Gastric polyps form when the lining of the stomach gets inflamed or damaged. Stomach inflammation and damage may be caused by:  A long-lasting stomach condition, such as gastritis.  Certain medicines used to reduce stomach acid.  An inherited condition called familial adenomatous polyposis. What are the signs or symptoms? Usually, this condition does not cause any symptoms. If you do have symptoms, they may include:  Pain or tenderness in the abdomen.  Nausea.  Trouble eating or swallowing.  Blood in the stool.  Anemia. How is this diagnosed? Gastric polyps are diagnosed with:  A medical procedure called endoscopy.  A lab test in which a part of the polyp is examined. This test is done with a sample of polyp tissue (biopsy) taken during an endoscopy. How is this  treated? Treatment depends on the type, location, and size of the polyps. Treatment may involve:  Having the polyps checked regularly with an endoscopy.  Having the polyps removed with an endoscopy. This may be done if the polyps are harmful or can become harmful. Removing a polyp often prevents problems from developing.  Having the polyps removed with  a surgery called a partial gastrectomy. This may be done in rare cases to remove very large polyps.  Treating the underlying condition that caused the polyps. Follow these instructions at home:  Take over-the-counter and prescription medicines only as told by your health care provider.  Keep all follow-up visits as told by your health care provider. This is important. Contact a health care provider if:  You develop new symptoms.  Your symptoms get worse. Get help right away if:  You vomit blood.  You have severe abdominal pain.  You cannot eat or drink.  You have blood in your stool. This information is not intended to replace advice given to you by your health care provider. Make sure you discuss any questions you have with your health care provider. Document Revised: 03/23/2017 Document Reviewed: 04/25/2015 Elsevier Patient Education  2020 [place].   Hiatal Hernia  A hiatal hernia occurs when part of the stomach slides above the muscle that separates the abdomen from the chest (diaphragm). A person can be born with a hiatal hernia (congenital), or it may develop over time. In almost all cases of hiatal hernia, only the top part of the stomach pushes through the diaphragm. Many people have a hiatal hernia with no symptoms. The larger the hernia, the more likely it is that you will have symptoms. In some cases, a hiatal hernia allows stomach acid to flow back into the tube that carries food from your mouth to your stomach (esophagus). This may cause heartburn symptoms. Severe heartburn symptoms may mean that you have developed a condition called gastroesophageal reflux disease (GERD). What are the causes? This condition is caused by a weakness in the opening (hiatus) where the esophagus passes through the diaphragm to attach to the upper part of the stomach. A person may be born with a weakness in the hiatus, or a weakness can develop over time. What increases the risk? This  condition is more likely to develop in:  Older people. Age is a major risk factor for a hiatal hernia, especially if you are over the age of 48.  Pregnant women.  People who are overweight.  People who have frequent constipation. What are the signs or symptoms? Symptoms of this condition usually develop in the form of GERD symptoms. Symptoms include:  Heartburn.  Belching.  Indigestion.  Trouble swallowing.  Coughing or wheezing.  Sore throat.  Hoarseness.  Chest pain.  Nausea and vomiting. How is this diagnosed? This condition may be diagnosed during testing for GERD. Tests that may be done include:  X-rays of your stomach or chest.  An upper gastrointestinal (GI) series. This is an X-ray exam of your GI tract that is taken after you swallow a chalky liquid that shows up clearly on the X-ray.  Endoscopy. This is a procedure to look into your stomach using a thin, flexible tube that has a tiny camera and light on the end of it. How is this treated? This condition may be treated by:  Dietary and lifestyle changes to help reduce GERD symptoms.  Medicines. These may include: ? Over-the-counter antacids. ? Medicines that make your stomach empty more  quickly. ? Medicines that block the production of stomach acid (H2 blockers). ? Stronger medicines to reduce stomach acid (proton pump inhibitors).  Surgery to repair the hernia, if other treatments are not helping. If you have no symptoms, you may not need treatment. Follow these instructions at home: Lifestyle and activity  Do not use any products that contain nicotine or tobacco, such as cigarettes and e-cigarettes. If you need help quitting, ask your health care provider.  Try to achieve and maintain a healthy body weight.  Avoid putting pressure on your abdomen. Anything that puts pressure on your abdomen increases the amount of acid that may be pushed up into your esophagus. ? Avoid bending over, especially  after eating. ? Raise the head of your bed by putting blocks under the legs. This keeps your head and esophagus higher than your stomach. ? Do not wear tight clothing around your chest or stomach. ? Try not to strain when having a bowel movement, when urinating, or when lifting heavy objects. Eating and drinking  Avoid foods that can worsen GERD symptoms. These may include: ? Fatty foods, like fried foods. ? Citrus fruits, like oranges or lemon. ? Other foods and drinks that contain acid, like orange juice or tomatoes. ? Spicy food. ? Chocolate.  Eat frequent small meals instead of three large meals a day. This helps prevent your stomach from getting too full. ? Eat slowly. ? Do not lie down right after eating. ? Do not eat 1-2 hours before bed.  Do not drink beverages with caffeine. These include cola, coffee, cocoa, and tea.  Do not drink alcohol. General instructions  Take over-the-counter and prescription medicines only as told by your health care provider.  Keep all follow-up visits as told by your health care provider. This is important. Contact a health care provider if:  Your symptoms are not controlled with medicines or lifestyle changes.  You are having trouble swallowing.  You have coughing or wheezing that will not go away. Get help right away if:  Your pain is getting worse.  Your pain spreads to your arms, neck, jaw, teeth, or back.  You have shortness of breath.  You sweat for no reason.  You feel sick to your stomach (nauseous) or you vomit.  You vomit blood.  You have bright red blood in your stools.  You have black, tarry stools. This information is not intended to replace advice given to you by your health care provider. Make sure you discuss any questions you have with your health care provider. Document Revised: 03/23/2017 Document Reviewed: 11/13/2016 Elsevier Patient Education  [place].   PATIENT  INSTRUCTIONS POST-ANESTHESIA  IMMEDIATELY FOLLOWING SURGERY:  Do not drive or operate machinery for the first twenty four hours after surgery.  Do not make any important decisions for twenty four hours after surgery or while taking narcotic pain medications or sedatives.  If you develop intractable nausea and vomiting or a severe headache please notify your doctor immediately.  FOLLOW-UP:  Please make an appointment with your surgeon as instructed. You do not need to follow up with anesthesia unless specifically instructed to do so.  WOUND CARE INSTRUCTIONS (if applicable):  Keep a dry clean dressing on the anesthesia/puncture wound site if there is drainage.  Once the wound has quit draining you may leave it open to air.  Generally you should leave the bandage intact for twenty four hours unless there is drainage.  If the epidural site drains for more than  36-48 hours please call the anesthesia department.  QUESTIONS?:  Please feel free to call your physician or the hospital operator if you have any questions, and they will be happy to assist you.

## 2019-08-28 ENCOUNTER — Other Ambulatory Visit: Payer: Self-pay

## 2019-08-28 LAB — SURGICAL PATHOLOGY

## 2019-09-02 ENCOUNTER — Other Ambulatory Visit (INDEPENDENT_AMBULATORY_CARE_PROVIDER_SITE_OTHER): Payer: Self-pay | Admitting: *Deleted

## 2019-09-02 DIAGNOSIS — R1013 Epigastric pain: Secondary | ICD-10-CM

## 2019-09-05 ENCOUNTER — Ambulatory Visit (HOSPITAL_COMMUNITY)
Admission: RE | Admit: 2019-09-05 | Discharge: 2019-09-05 | Disposition: A | Discharge status: Home / Self Care | Payer: Medicare Other | Source: Ambulatory Visit | Attending: Internal Medicine | Admitting: Internal Medicine

## 2019-09-05 ENCOUNTER — Other Ambulatory Visit: Payer: Self-pay

## 2019-09-05 DIAGNOSIS — R1013 Epigastric pain: Secondary | ICD-10-CM | POA: Diagnosis not present

## 2019-09-05 IMAGING — US US ABDOMEN COMPLETE
1 series · 13 of 25 positions shown · non-contrast
Comparison: None.

CLINICAL DATA: Epigastric abdominal pain. History of liver cysts.
Cholecystectomy 2 years ago.

EXAM:
ABDOMEN ULTRASOUND COMPLETE

[Series 1: us abdomen complete · 13 of 104 slices shown]
[im 1/104]
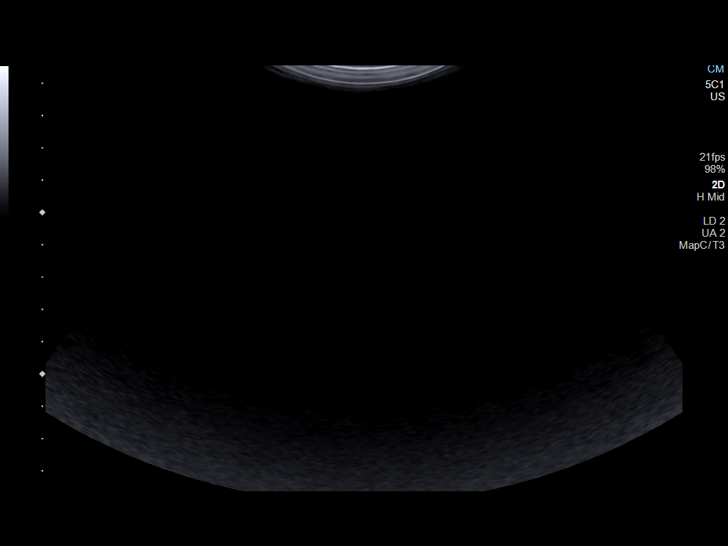
[im 9/104]
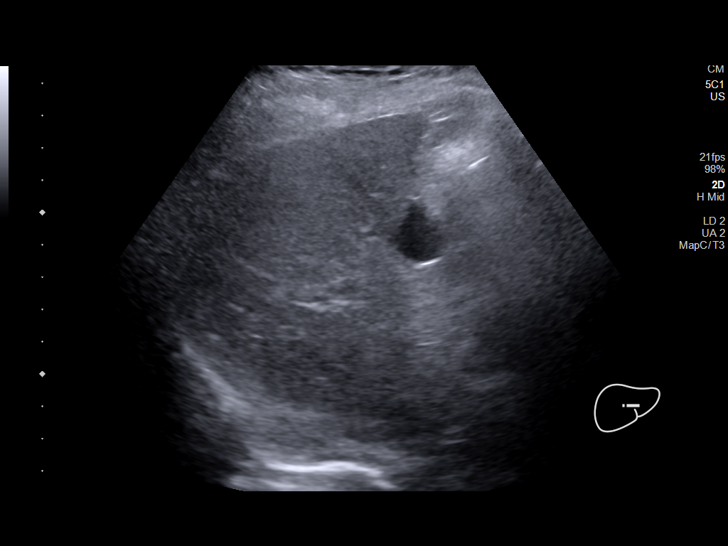
[im 18/104]
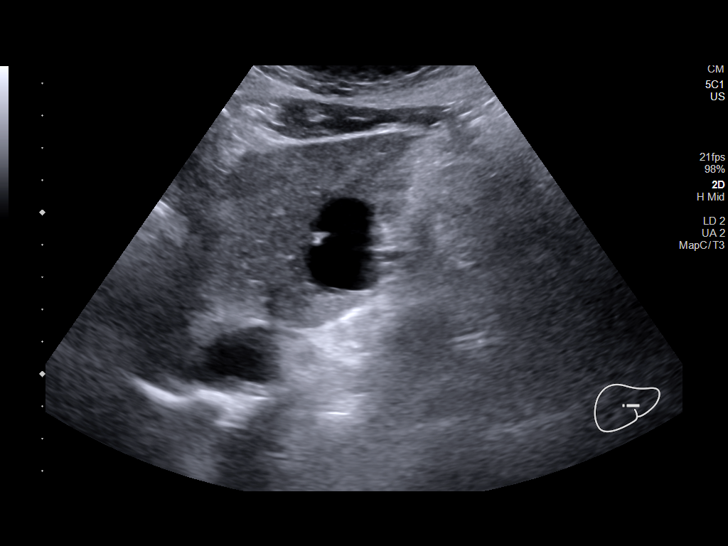
[im 26/104]
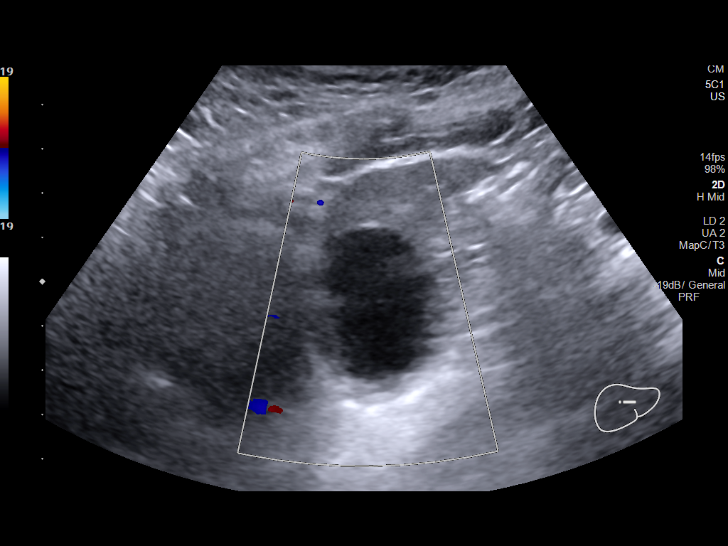
[im 35/104]
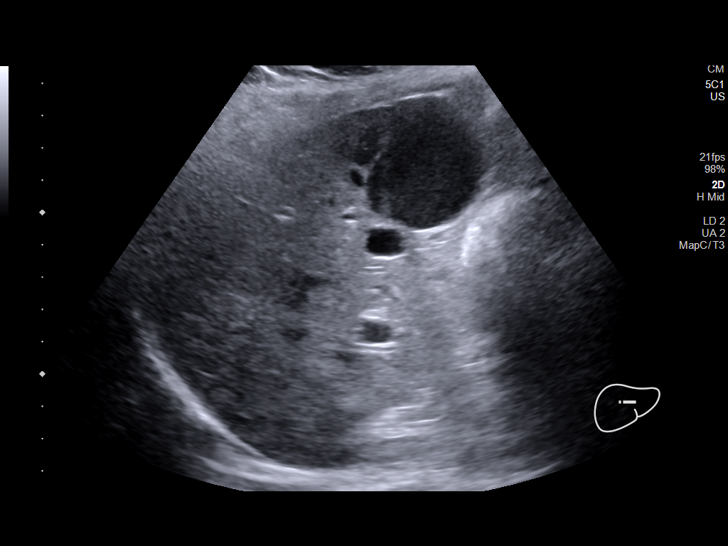
[im 43/104]
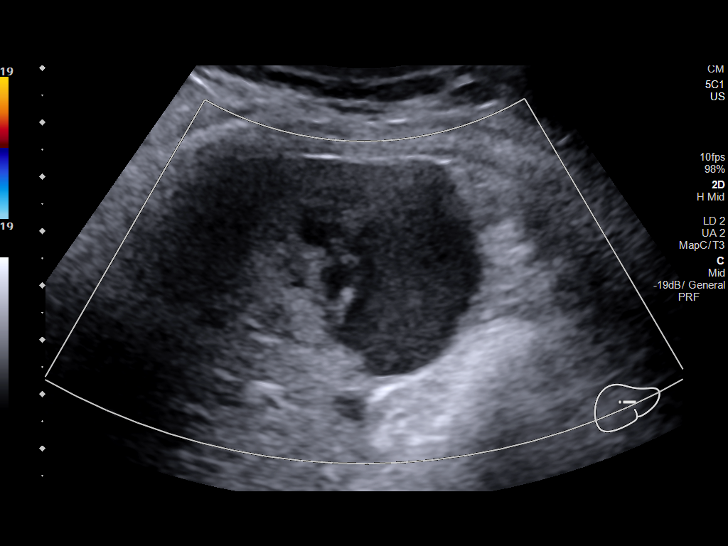
[im 52/104]
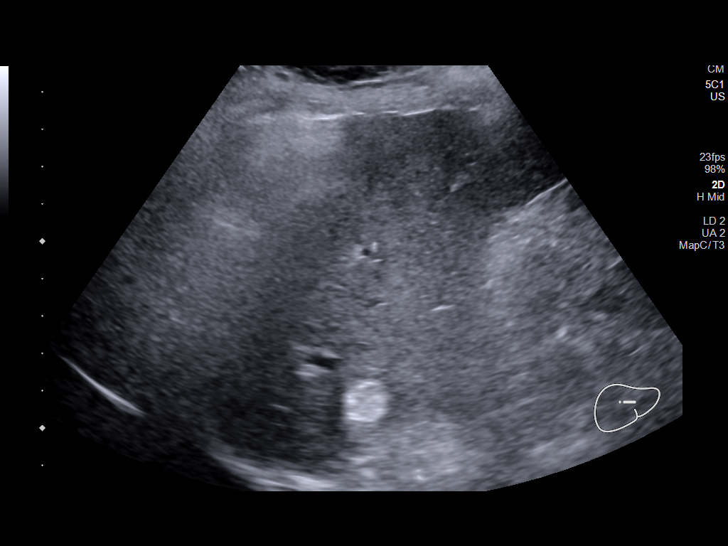
[im 61/104]
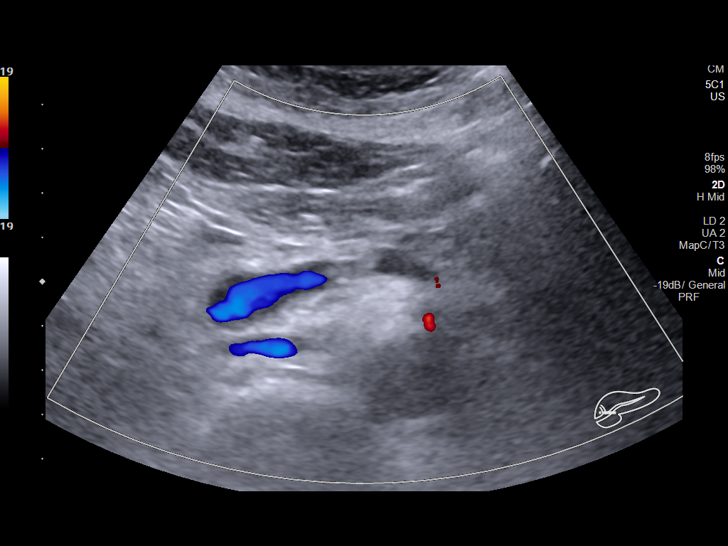
[im 69/104]
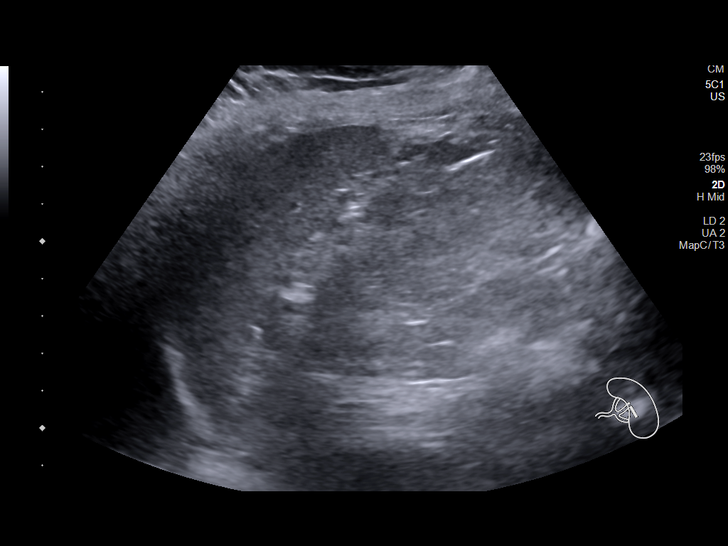
[im 78/104]
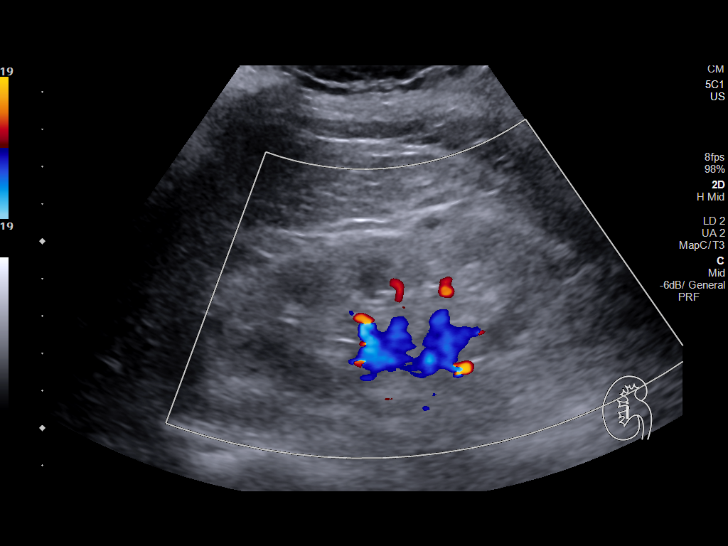
[im 86/104]
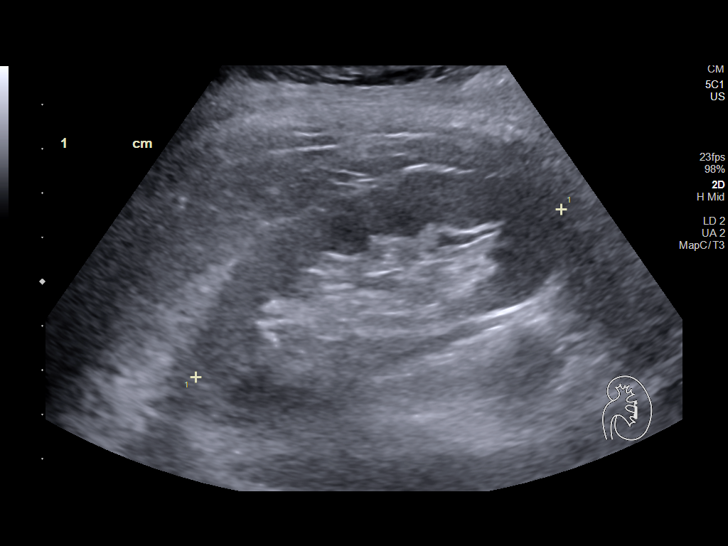
[im 95/104]
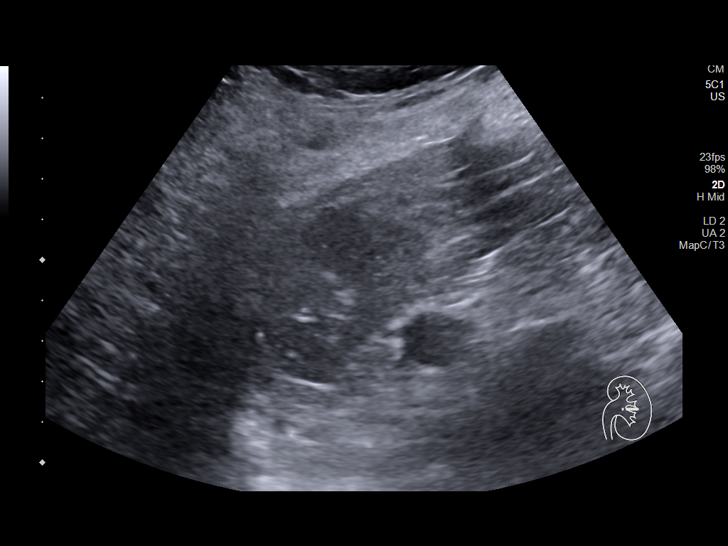
[im 104/104]
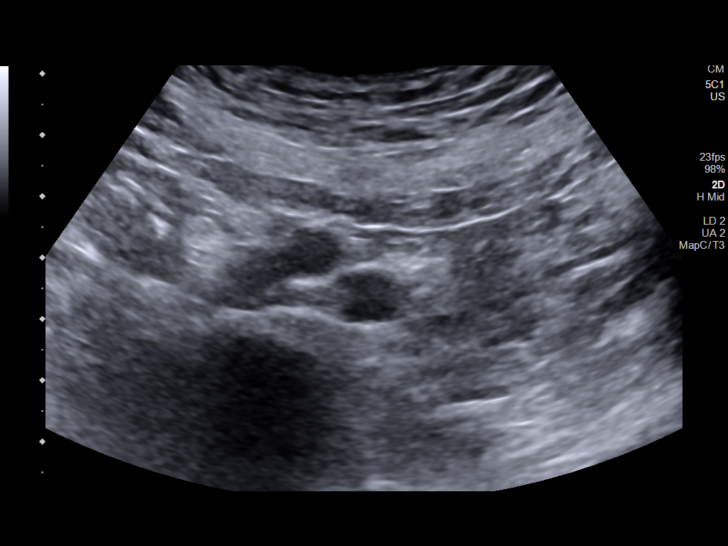

[13 of 25 positions shown; findings below may reference images not displayed]

FINDINGS: Gallbladder: Surgically absent.

Common bile duct: Diameter: 2.4 mm

Liver: 5.2 x 5.2 x 4.6 cm rounded mass in the inferior aspect of the
right lobe of the liver, with hypoechoic and echogenic components.
No internal blood flow seen with color Doppler.

3.6 x 3.2 x 2.9 cm oval, near anechoic mass in the posterior aspect
of the left lobe of the liver, containing a mildly thickened
internal septation. No internal blood flow with color Doppler.

Several additional smaller liver cysts.

1.4 x 1.3 x 1.3 cm oval, echogenic mass in the posterior aspect of
the liver inferiorly on the right. Portal vein is patent on color
Doppler imaging with normal direction of blood flow towards the
liver.

IVC: No abnormality visualized.

Pancreas: Visualized portion unremarkable.

Spleen: Size and appearance within normal limits.

Right Kidney: Length: 9.9 cm. Echogenicity within normal limits. No
mass or hydronephrosis visualized.

Left Kidney: Length: 9.1 cm. Echogenicity within normal limits. No
mass or hydronephrosis visualized.

Abdominal aorta: No aneurysm visualized.

Other findings: None.
IMPRESSION: 1. Multiple liver cysts. The largest is in the inferior aspect of
the right lobe and contains consolidated internal debris with no
definite solid component.
2. 1.4 cm probable hemangioma in the right lobe of the liver
posteriorly. This could be confirmed with pre and postcontrast
magnetic resonance imaging of the liver if clinically indicated.
3. Status post cholecystectomy.

## 2019-09-09 NOTE — Progress Notes (Signed)
Left message for patient to call me

## 2019-10-15 ENCOUNTER — Ambulatory Visit
Admission: RE | Admit: 2019-10-15 | Discharge: 2019-10-15 | Disposition: A | Discharge status: Home / Self Care | Payer: Self-pay | Source: Ambulatory Visit | Attending: Internal Medicine | Admitting: Internal Medicine

## 2019-10-15 ENCOUNTER — Other Ambulatory Visit (HOSPITAL_COMMUNITY): Payer: Self-pay | Admitting: Internal Medicine

## 2019-10-15 DIAGNOSIS — R52 Pain, unspecified: Secondary | ICD-10-CM

## 2019-10-17 ENCOUNTER — Other Ambulatory Visit (INDEPENDENT_AMBULATORY_CARE_PROVIDER_SITE_OTHER): Payer: Self-pay | Admitting: Internal Medicine

## 2019-10-17 MED ORDER — PANTOPRAZOLE SODIUM 40 MG PO TBEC: 40.0000 mg | DELAYED_RELEASE_TABLET | Freq: Every day | ORAL | 3 refills | Status: DC

## 2019-10-17 NOTE — Progress Notes (Signed)
Patient says she has been watching her diet.  She is still having symptoms particularly if she eats regular meals. She cannot tell much difference with Nexium.  We will switch her back to pantoprazole which is more cost effective.

## 2019-10-30 ENCOUNTER — Ambulatory Visit (INDEPENDENT_AMBULATORY_CARE_PROVIDER_SITE_OTHER): Payer: Medicare Other | Admitting: Gastroenterology

## 2019-10-30 ENCOUNTER — Encounter (INDEPENDENT_AMBULATORY_CARE_PROVIDER_SITE_OTHER): Payer: Self-pay | Admitting: Gastroenterology

## 2019-10-30 ENCOUNTER — Other Ambulatory Visit: Payer: Self-pay

## 2019-10-30 VITALS — BP 105/76 | HR 86 | Temp 98.8°F | Ht 64.0 in | Wt 133.9 lb

## 2019-10-30 DIAGNOSIS — R140 Abdominal distension (gaseous): Secondary | ICD-10-CM

## 2019-10-30 DIAGNOSIS — R6881 Early satiety: Secondary | ICD-10-CM | POA: Diagnosis not present

## 2019-10-30 DIAGNOSIS — K219 Gastro-esophageal reflux disease without esophagitis: Secondary | ICD-10-CM

## 2019-10-30 NOTE — Progress Notes (Signed)
Patient profile: Jeanette Rasmussen is a 74 y.o. female seen for follow up of gerd  History of Present Illness: Jeanette Rasmussen is seen today for follow up. She reports good days and bad days, reports "bad days" occur on average 3-4x/week. She reports bloating worse after meals on the bad days, bloating worse in upper abd-can radiate to lower chest. Reports even with PPI has esophageal burning (occasional during day, mainly at night), gaviscon helps esophageal bruning. Occasional nausea. No vomiting. Some epigastric pain, can be shooting pain in her side-feels this happens at random without relation to meals or movement. Also endorses early satiety, eating about 50% of meal feels her up.   She takes miralax every morning-moving bowles about every other day. Doesn't feel having a BM resolves bloating. If doesn't take regular miralax has worse straining but feels miralax may worsen her bloating.   No nsaids. Non smoker. ocasional alcohol less than once a week.   She has 12oz soda daily, does eat a lot of raw vegetables. Has not noted dairy makes symptoms worse.     Wt Readings from Last 3 Encounters:  10/30/19 133 lb 14.4 oz (60.7 kg)  08/27/19 134 lb (60.8 kg)  07/22/19 134 lb 4.8 oz (60.9 kg)     Last Colonoscopy: 11/2018--Three small polyps in the proximal sigmoid colon, in the ascending colon and in the cecum. Biopsied. - Diverticulosis in the proximal sigmoid colon and at the hepatic flexure. - Patent end-to-end colo-rectal anastomosis, characterized by healthy appearing mucosa. - External hemorrhoids. - Anal papilla(e) were hypertrophied. Patient had 3 small polyps removed and there are tubular adenomas. Next colonoscopy in 5 years    Last Endoscopy: 08/27/19-second portion of the duodenum were normal. Impression:               - Normal hypopharynx.                           - Normal esophagus.                           - Z-line regular, 35 cm from the incisors.                            - 3 cm hiatal hernia.                           - A few gastric polyps. Biopsied.                           - Gastritis. Biopsied.                           - Scar in the gastric antrum.                           - Normal duodenal bulb and second portion of the                            duodenum.   Past Medical History:  Past Medical History:  Diagnosis Date  . High cholesterol   . Hypertension   . PONV (postoperative nausea and vomiting)     Problem  List: Patient Active Problem List   Diagnosis Date Noted  . GERD (gastroesophageal reflux disease) 07/22/2019  . Abdominal pain, epigastric 07/22/2019  . History of colonic polyps 07/01/2018  . Diverticulitis of colon 07/01/2018    Past Surgical History: Past Surgical History:  Procedure Laterality Date  . BACK SURGERY     x 2  . BIOPSY  12/19/2018   Procedure: BIOPSY;  Surgeon: Rogene Houston, MD;  [facility];  Service: Endoscopy;;  colon polyp   . BIOPSY  08/27/2019   Procedure: BIOPSY;  Surgeon: Rogene Houston, MD;  [facility]: [facility];  Service: Endoscopy;;  antrum  . CHOLECYSTECTOMY    . COLONOSCOPY N/A 12/19/2018   Procedure: COLONOSCOPY;  Surgeon: Rogene Houston, MD;  [facility]: [facility];  Service: Endoscopy;  Laterality: N/A;  1:00  . complete hysterectomy    . ESOPHAGOGASTRODUODENOSCOPY N/A 08/27/2019   Procedure: ESOPHAGOGASTRODUODENOSCOPY (EGD);  Surgeon: Rogene Houston, MD;  [facility]: [facility];  Service: Endoscopy;  Laterality: N/A;  245  . hemicolectdomy     2018 Dr. Audrie Lia  . Left hernia      inguinal (Left)  . NECK SURGERY     x 3  . POLYPECTOMY  08/27/2019   Procedure: POLYPECTOMY;  Surgeon: Rogene Houston, MD;  Location: [facility];  Service: Endoscopy;;  gastric    Allergies: Allergies  Allergen Reactions  . Penicillins     Rash, itching.  Did it involve swelling of the face/tongue/throat, SOB, or low BP? No Did it involve sudden or severe rash/hives,  skin peeling, or any reaction on the inside of your mouth or nose? No Did you need to seek medical attention at a hospital or doctor's office? No When did it last happen?its been years  If all above answers are "NO", may proceed with cephalosporin use.   . Sulfa Antibiotics     Rash,itching      Home Medications:  Current Outpatient Medications:  .  Alum Hydroxide-Mag Trisilicate (GAVISCON) 44-31.5 MG CHEW, Chew 1 tablet by mouth at bedtime., Disp: , Rfl:  .  Ascorbic Acid (VITAMIN C) 1000 MG tablet, Take 1,000 mg by mouth daily., Disp: , Rfl:  .  aspirin EC 81 MG tablet, Take 1 tablet (81 mg total) by mouth daily., Disp:  , Rfl:  .  Cholecalciferol (VITAMIN D3) 125 MCG (5000 UT) CAPS, Take 5,000 Units by mouth daily. , Disp: , Rfl:  .  Glucosamine-Chondroit-Vit C-Mn (GLUCOSAMINE 1500 COMPLEX PO), Take 1 tablet by mouth daily. , Disp: , Rfl:  .  lisinopril-hydrochlorothiazide (PRINZIDE,ZESTORETIC) 10-12.5 MG tablet, Take 1 tablet by mouth daily., Disp: , Rfl:  .  loratadine (CLARITIN) 10 MG tablet, Take 10 mg by mouth daily as needed for allergies. , Disp: , Rfl:  .  lovastatin (MEVACOR) 20 MG tablet, Take 20 mg by mouth at bedtime., Disp: , Rfl:  .  Multiple Vitamin (MULTIVITAMIN) tablet, Take 1 tablet by mouth daily., Disp: , Rfl:  .  Omega-3 Fatty Acids (FISH OIL) 1200 MG CAPS, Take 1,200 mg by mouth daily. , Disp: , Rfl:  .  pantoprazole (PROTONIX) 40 MG tablet, Take 1 tablet (40 mg total) by mouth daily before breakfast., Disp: 90 tablet, Rfl: 3 .  polyethylene glycol (MIRALAX / GLYCOLAX) 17 g packet, Take 17 g by mouth daily. Patient states that she takes 1 teaspoon daily., Disp: , Rfl:    Family History: family history includes Stomach cancer in her maternal grandfather.  Social History:   reports that she has never smoked. She has never used smokeless tobacco. She reports current alcohol use. She reports that she does not use drugs.   Review of  Systems: Constitutional: Denies weight loss/weight gain  Eyes: No changes in vision. ENT: No oral lesions, sore throat.  GI: see HPI.  Heme/Lymph: No easy bruising.  CV: No chest pain.  GU: No hematuria.  Integumentary: No rashes.  Neuro: No headaches.  Psych: No depression/anxiety.  Endocrine: No heat/cold intolerance.  Allergic/Immunologic: No urticaria.  Resp: No cough, SOB.  Musculoskeletal: No joint swelling.    Physical Examination: BP 105/76 (BP Location: Right Arm, Patient Position: Sitting, Cuff Size: Normal)   Pulse 86   Temp 98.8 F (37.1 C) (Oral)   Ht 5\' 4"  (1.626 m)   Wt 133 lb 14.4 oz (60.7 kg)   BMI 22.98 kg/m  Gen: NAD, alert and oriented x 4 HEENT: PEERLA, EOMI, Neck: supple, no JVD Chest: CTA bilaterally, no wheezes, crackles, or other adventitious sounds CV: RRR, no m/g/c/r Abd: soft, NT, ND, +BS in all four quadrants; no HSM, guarding, ridigity, or rebound tenderness Ext: no edema, well perfused with 2+ pulses, Skin: no rash or lesions noted on observed skin Lymph: no noted LAD  Data Reviewed:  [place] ABD complete IMPRESSION: 1. Multiple liver cysts. The largest is in the inferior aspect of the right lobe and contains consolidated internal debris with no definite solid component. 2. 1.4 cm probable hemangioma in the right lobe of the liver posteriorly. This could be confirmed with pre and postcontrast magnetic resonance imaging of the liver if clinically indicated. 3. Status post cholecystectomy-  Patient reports known hx of liver cysts.     Assessment/Plan: Ms. Yepez is a 74 y.o. female seen for follow-up.  She had an upper endoscopy and ultrasound as above  1.  GERD/early satiety/bloating-will discuss ordering gastric emptying scan with Dr.Rehman.  Patient will in interim try some diet modifications including decreasing her intake of raw vegetables and sodas, she will try probiotic daily with Phazyme as needed.  She also look for food  triggers.  67-month course of Nexium did not help symptoms and she is transition back to Protonix.  2.  Chronic constipation-well-controlled on MiraLAX daily.  Up-to-date on colonoscopy.  Does have history of diverticulosis and prior resection for diverticulitis.    Sahaana was seen today for follow-up.  Diagnoses and all orders for this visit:  Bloating  Chronic GERD  Early satiety    I personally performed the service, non-incident to. (WP)  Laurine Blazer, [facility]

## 2019-10-30 NOTE — Patient Instructions (Addendum)
Try probiotic - phillips colon or align (over the counter)-take this daily.  Try to avoid carbonation for 1-2 weeks to see if helps.  Try phazyme - this is as needed for gas and bloating - over the counter.  -Watch if sodas or raw vegetables worsen symptoms.

## 2019-11-05 ENCOUNTER — Telehealth (INDEPENDENT_AMBULATORY_CARE_PROVIDER_SITE_OTHER): Payer: Self-pay | Admitting: Gastroenterology

## 2019-11-05 DIAGNOSIS — K219 Gastro-esophageal reflux disease without esophagitis: Secondary | ICD-10-CM

## 2019-11-05 DIAGNOSIS — R140 Abdominal distension (gaseous): Secondary | ICD-10-CM

## 2019-11-05 DIAGNOSIS — R6881 Early satiety: Secondary | ICD-10-CM

## 2019-11-05 NOTE — Telephone Encounter (Signed)
I contact patient with Dr. Otelia Limes recommendations.  She is still having symptoms.  We will plan for gastric emptying scan for evaluation.

## 2019-11-05 NOTE — Telephone Encounter (Signed)
GES sch'd 11/13/19 at 8 am (745), npo after midnight, no stomach medication after midnight, left detailed  message for patient

## 2019-11-13 ENCOUNTER — Other Ambulatory Visit: Payer: Self-pay

## 2019-11-13 ENCOUNTER — Ambulatory Visit (HOSPITAL_COMMUNITY)
Admission: RE | Admit: 2019-11-13 | Discharge: 2019-11-13 | Disposition: A | Discharge status: Home / Self Care | Payer: Medicare Other | Source: Ambulatory Visit | Attending: Gastroenterology | Admitting: Gastroenterology

## 2019-11-13 ENCOUNTER — Encounter (HOSPITAL_COMMUNITY): Payer: Self-pay

## 2019-11-13 DIAGNOSIS — R140 Abdominal distension (gaseous): Secondary | ICD-10-CM | POA: Insufficient documentation

## 2019-11-13 DIAGNOSIS — K219 Gastro-esophageal reflux disease without esophagitis: Secondary | ICD-10-CM | POA: Diagnosis present

## 2019-11-13 DIAGNOSIS — R6881 Early satiety: Secondary | ICD-10-CM | POA: Diagnosis present

## 2019-11-13 MED ORDER — TECHNETIUM TC 99M SULFUR COLLOID
2.0000 | Freq: Once | INTRAVENOUS | Status: AC | PRN
  Administered 2019-11-13: 2 via ORAL

## 2019-11-21 NOTE — Telephone Encounter (Signed)
Patient returned your call - stated best number to reach her is [phone]

## 2019-11-26 MED ORDER — METOCLOPRAMIDE HCL 5 MG PO TABS
5.0000 mg | ORAL_TABLET | Freq: Three times a day (TID) | ORAL | 1 refills | Status: DC
Start: 2019-11-26 — End: 2022-04-27

## 2019-11-26 NOTE — Telephone Encounter (Signed)
I discussed results w/ patient. Will try reglan -side effect profile reviewed.  To notify me if develops any side effects such as tremor, twitches, abnormal movements, etc.  Mitzie - would like to see her back in the office in 6-8 weeks to check progress of medication. Thanks.

## 2019-11-26 NOTE — Telephone Encounter (Signed)
I called pt w/ results. No answer. LMOM for return call.

## 2020-01-29 ENCOUNTER — Ambulatory Visit (INDEPENDENT_AMBULATORY_CARE_PROVIDER_SITE_OTHER): Payer: Medicare Other | Admitting: Gastroenterology

## 2020-11-03 ENCOUNTER — Other Ambulatory Visit (INDEPENDENT_AMBULATORY_CARE_PROVIDER_SITE_OTHER): Payer: Self-pay | Admitting: Internal Medicine

## 2021-01-31 ENCOUNTER — Other Ambulatory Visit (INDEPENDENT_AMBULATORY_CARE_PROVIDER_SITE_OTHER): Payer: Self-pay | Admitting: Internal Medicine

## 2021-03-30 ENCOUNTER — Encounter: Payer: Self-pay | Admitting: Dermatology

## 2021-03-30 ENCOUNTER — Ambulatory Visit (INDEPENDENT_AMBULATORY_CARE_PROVIDER_SITE_OTHER): Payer: Medicare Other | Admitting: Dermatology

## 2021-03-30 ENCOUNTER — Other Ambulatory Visit: Payer: Self-pay

## 2021-03-30 DIAGNOSIS — L570 Actinic keratosis: Secondary | ICD-10-CM | POA: Diagnosis not present

## 2021-03-30 DIAGNOSIS — Z1283 Encounter for screening for malignant neoplasm of skin: Secondary | ICD-10-CM

## 2021-03-30 DIAGNOSIS — D485 Neoplasm of uncertain behavior of skin: Secondary | ICD-10-CM | POA: Diagnosis not present

## 2021-03-30 NOTE — Patient Instructions (Signed)

## 2021-04-18 ENCOUNTER — Encounter: Payer: Self-pay | Admitting: Dermatology

## 2021-04-18 NOTE — Progress Notes (Signed)
° °  New Patient   Subjective  Jeanette Rasmussen is a 75 y.o. female who presents for the following: Skin Problem (Wants spots on chin checked - "dark" ).  General skin check, several spots of concern Location:  Duration:  Quality:  Associated Signs/Symptoms: Modifying Factors:  Severity:  Timing: Context:    The following portions of the chart were reviewed this encounter and updated as appropriate:  Tobacco   Allergies   Meds   Problems   Med Hx   Surg Hx   Fam Hx       Objective  Well appearing patient in no apparent distress; mood and affect are within normal limits. All sun exposed areas plus upper chest and back examined, no atypical pigmented lesions.  2 possible carcinomas on face will be biopsied.  Left Parotid Area 5 mm pink crust       Left Mid Chin Waxy ill-defined 7 mm       Right Nasal Sidewall 3 mm gritty pink crust       All sun exposed areas plus back examined.   Assessment & Plan  Neoplasm of uncertain behavior of skin (2) Left Parotid Area  Skin / nail biopsy Type of biopsy: tangential   Informed consent: discussed and consent obtained   Timeout: patient name, date of birth, surgical site, and procedure verified   Procedure prep:  Patient was prepped and draped in usual sterile fashion (Non sterile) Prep type:  Chlorhexidine Anesthesia: the lesion was anesthetized in a standard fashion   Anesthetic:  1% lidocaine w/ epinephrine 1-100,000 local infiltration Instrument used: flexible razor blade   Hemostasis achieved with: ferric subsulfate and electrodesiccation   Outcome: patient tolerated procedure well   Post-procedure details: sterile dressing applied and wound care instructions given   Dressing type: bandage and petrolatum    Specimen 1 - Surgical pathology Differential Diagnosis: R/O BCC vs SCC - cautery after biopsy  Check Margins: No  Left Mid Chin  Skin / nail biopsy Type of biopsy: tangential   Informed consent:  discussed and consent obtained   Timeout: patient name, date of birth, surgical site, and procedure verified   Procedure prep:  Patient was prepped and draped in usual sterile fashion (Non sterile) Prep type:  Chlorhexidine Anesthesia: the lesion was anesthetized in a standard fashion   Anesthetic:  1% lidocaine w/ epinephrine 1-100,000 local infiltration Instrument used: flexible razor blade   Hemostasis achieved with: ferric subsulfate   Outcome: patient tolerated procedure well   Post-procedure details: sterile dressing applied and wound care instructions given   Dressing type: bandage and petrolatum    Specimen 2 - Surgical pathology Differential Diagnosis: R/O BCC vs SCC   Check Margins: No  AK (actinic keratosis) Right Nasal Sidewall  Destruction of lesion - Right Nasal Sidewall Complexity: simple   Destruction method: cryotherapy   Informed consent: discussed and consent obtained   Lesion destroyed using liquid nitrogen: Yes   Cryotherapy cycles:  3 Outcome: patient tolerated procedure well with no complications    Encounter for screening for malignant neoplasm of skin  Annual skin examination.

## 2021-05-03 ENCOUNTER — Other Ambulatory Visit (INDEPENDENT_AMBULATORY_CARE_PROVIDER_SITE_OTHER): Payer: Self-pay | Admitting: Internal Medicine

## 2021-05-03 NOTE — Telephone Encounter (Signed)
Needs office visit.

## 2021-05-19 ENCOUNTER — Other Ambulatory Visit (INDEPENDENT_AMBULATORY_CARE_PROVIDER_SITE_OTHER): Payer: Self-pay | Admitting: Internal Medicine

## 2021-05-30 ENCOUNTER — Other Ambulatory Visit (INDEPENDENT_AMBULATORY_CARE_PROVIDER_SITE_OTHER): Payer: Self-pay | Admitting: Internal Medicine

## 2021-05-30 NOTE — Telephone Encounter (Signed)
Needs office visit.

## 2021-06-13 ENCOUNTER — Ambulatory Visit (INDEPENDENT_AMBULATORY_CARE_PROVIDER_SITE_OTHER): Payer: Medicare Other | Admitting: Gastroenterology

## 2021-07-18 ENCOUNTER — Ambulatory Visit (INDEPENDENT_AMBULATORY_CARE_PROVIDER_SITE_OTHER): Payer: Medicare Other | Admitting: Gastroenterology

## 2021-08-01 ENCOUNTER — Telehealth (INDEPENDENT_AMBULATORY_CARE_PROVIDER_SITE_OTHER): Payer: Medicare Other | Admitting: Gastroenterology

## 2022-03-09 LAB — HM COLONOSCOPY

## 2022-04-05 ENCOUNTER — Ambulatory Visit: Payer: Medicare Other | Admitting: Dermatology

## 2022-04-27 ENCOUNTER — Encounter (INDEPENDENT_AMBULATORY_CARE_PROVIDER_SITE_OTHER): Payer: Self-pay | Admitting: Gastroenterology

## 2022-04-27 ENCOUNTER — Ambulatory Visit (INDEPENDENT_AMBULATORY_CARE_PROVIDER_SITE_OTHER): Payer: Medicare Other | Admitting: Gastroenterology

## 2022-04-27 VITALS — BP 122/81 | HR 80 | Temp 97.8°F | Ht 64.0 in | Wt 132.2 lb

## 2022-04-27 DIAGNOSIS — R140 Abdominal distension (gaseous): Secondary | ICD-10-CM

## 2022-04-27 DIAGNOSIS — K5900 Constipation, unspecified: Secondary | ICD-10-CM

## 2022-04-27 DIAGNOSIS — K219 Gastro-esophageal reflux disease without esophagitis: Secondary | ICD-10-CM

## 2022-04-27 MED ORDER — SIMETHICONE 80 MG PO CHEW: 80.0000 mg | CHEWABLE_TABLET | Freq: Four times a day (QID) | ORAL | 1 refills | Status: AC | PRN

## 2022-04-27 NOTE — Progress Notes (Addendum)
Referring Provider: Earney Mallet, MD Primary Care Physician:  Earney Mallet, MD Primary GI Physician: previously [facility]  Chief Complaint  Patient presents with   Constipation    Patient here today due to constipation, Bloating, and gas. She is taking miralax one table spoon per day. She says her reflux is better on Omeprazole 40 mg once per day in am and famotidine 20 mg QHS.  She also takes a Administrator, arts.   HPI:   Jeanette Rasmussen is a 78 y.o. female with past medical history of high cholesterol, HTN, GERD.  Patient presenting today for constipation, bloating, gas and GERD follow up.  Constipation: Patient states that she had a colonoscopy in [place] in November as she called here and could not see [place] so she went to them for constipation but wanted to keep her GI care here. Colonoscopy showed a few polyps. She was told to take miralax. Taking 1T per day and having a BM daily though having to strain some when she goes though better than previously. No recent labs done. She feels that miralax causes her to feel more gassy. Not taking anything for gas. She notes that she has always had some issues with constipation though worse over the past few months. Has lost maybe a few pounds but nothing big, notes appetite is somewhat decreased since feeling bloated. She does not feel that she is emptying her bowels well, has not tried anything other than miralax. Drinks about 1 glass of water per day. Does eat an apple a few times per day. She is also taking align probiotic, unsure if this is helping. She has gone a few days without a BM if she is not taking something. No rectal bleeding, melena or abdominal pain.   GERD: she is doing omeprazole '40mg'$  daily and pepcid '20mg'$  QHS, she has raised HOB, about once per week she has reflux that will wake her up with some acid coming up into her throat. She does not take anything when this occurs. No cough or sore throat.   Denies abdominal pain,  dysphagia, nausea, vomiting. No frequent NSAID use or ETOH.   Last Colonoscopy: November 2023, 61m and 471mplyp in ascending and transverse colon, mild diverticulosis of entire colon. Ascending Biopsy showed colonoic mucosa with focal glandular hypertrophy and lymphoid aggregatre, no adenomatous glands found, transverse polyp TA  Last Endoscopy: 08/27/19-second portion of the duodenum were normal. Impression: - Normal hypopharynx.  - Normal esophagus. - Z-line regular, 35 cm from the incisors. - 3 cm hiatal hernia.  - A few gastric polyps. Biopsied. - Gastritis. Biopsied. - Scar in the gastric antrum. - Normal duodenal bulb and second portion of the  duodenum.  Past Medical History:  Diagnosis Date   High cholesterol    Hypertension    PONV (postoperative nausea and vomiting)     Past Surgical History:  Procedure Laterality Date   BACK SURGERY     x 2   BIOPSY  12/19/2018   Procedure: BIOPSY;  Surgeon: ReRogene HoustonMD;  [facility];  Service: Endoscopy;;  colon polyp    BIOPSY  08/27/2019   Procedure: BIOPSY;  Surgeon: ReRogene HoustonMD;  [facility];  Service: Endoscopy;;  antrum   CHOLECYSTECTOMY     COLONOSCOPY N/A 12/19/2018   Procedure: COLONOSCOPY;  Surgeon: ReRogene HoustonMD;  [facility]: [facility];  Service: Endoscopy;  Laterality: N/A;  1:00   complete hysterectomy  ESOPHAGOGASTRODUODENOSCOPY N/A 08/27/2019   Procedure: ESOPHAGOGASTRODUODENOSCOPY (EGD);  Surgeon: Rogene Houston, MD;  [facility]: [facility];  Service: Endoscopy;  Laterality: N/A;  245   hemicolectdomy     2018 Dr. Audrie Lia   Left hernia      inguinal (Left)   NECK SURGERY     x 3   POLYPECTOMY  08/27/2019   Procedure: POLYPECTOMY;  Surgeon: Rogene Houston, MD;  [facility]: [facility];  Service: Endoscopy;;  gastric    Current Outpatient Medications  Medication Sig Dispense Refill   Alum Hydroxide-Mag Trisilicate (GAVISCON) 53-29.9 MG CHEW Chew 1 tablet by mouth  at bedtime.     Ascorbic Acid (VITAMIN C) 1000 MG tablet Take 1,000 mg by mouth daily.     aspirin EC 81 MG tablet Take 1 tablet (81 mg total) by mouth daily.     Cholecalciferol (VITAMIN D3) 125 MCG (5000 UT) CAPS Take 5,000 Units by mouth daily.      famotidine (PEPCID) 20 MG tablet Take 20 mg by mouth at bedtime.     Glucosamine-Chondroit-Vit C-Mn (GLUCOSAMINE 1500 COMPLEX PO) Take 1 tablet by mouth daily.      lisinopril-hydrochlorothiazide (PRINZIDE,ZESTORETIC) 10-12.5 MG tablet Take 1 tablet by mouth daily.     loratadine (CLARITIN) 10 MG tablet Take 10 mg by mouth daily as needed for allergies.      lovastatin (MEVACOR) 20 MG tablet Take 20 mg by mouth at bedtime.     Multiple Vitamin (MULTIVITAMIN) tablet Take 1 tablet by mouth daily.     Omega-3 Fatty Acids (FISH OIL) 1200 MG CAPS Take 1,200 mg by mouth daily.      omeprazole (PRILOSEC) 40 MG capsule Take 40 mg by mouth daily.     polyethylene glycol (MIRALAX / GLYCOLAX) 17 g packet Take 17 g by mouth daily. Patient states that she takes 1 teaspoon daily.     Probiotic Product (ALIGN PO) Take by mouth daily at 6 (six) AM.     No current facility-administered medications for this visit.    Allergies as of 04/27/2022 - Review Complete 04/27/2022  Allergen Reaction Noted   Penicillins  07/01/2018   Sulfa antibiotics  07/01/2018    Family History  Problem Relation Age of Onset   Stomach cancer Maternal Grandfather     Social History   Socioeconomic History   Marital status: Married    Spouse name: Not on file   Number of children: Not on file   Years of education: Not on file   Highest education level: Not on file  Occupational History   Not on file  Tobacco Use   Smoking status: Never   Smokeless tobacco: Never  Vaping Use   Vaping Use: Never used  Substance and Sexual Activity   Alcohol use: Yes    Comment: once in a while   Drug use: Never   Sexual activity: Not on file  Other Topics Concern   Not on file   Social History Narrative   Not on file   Social Determinants of Health   Financial Resource Strain: Not on file  Food Insecurity: Not on file  Transportation Needs: Not on file  Physical Activity: Not on file  Stress: Not on file  Social Connections: Not on file   Review of systems General: negative for malaise, night sweats, fever, chills, weight loss  Neck: Negative for lumps, goiter, pain and significant neck swelling Resp: Negative for cough, wheezing, dyspnea at rest CV: Negative for chest  pain, leg swelling, palpitations, orthopnea GI: denies melena, hematochezia, nausea, vomiting, diarrhea, dysphagia, odyonophagia, early satiety or unintentional weight loss. +constipation +bloating +GERD symptoms  MSK: Negative for joint pain or swelling, back pain, and muscle pain. Derm: Negative for itching or rash Psych: Denies depression, anxiety, memory loss, confusion. No homicidal or suicidal ideation.  Heme: Negative for prolonged bleeding, bruising easily, and swollen nodes. Endocrine: Negative for cold or heat intolerance, polyuria, polydipsia and goiter. Neuro: negative for tremor, gait imbalance, syncope and seizures. The remainder of the review of systems is noncontributory.  Physical Exam: BP 122/81 (BP Location: Left Arm, Patient Position: Sitting, Cuff Size: Large)   Pulse 80   Temp 97.8 F (36.6 C) (Temporal)   Ht '5\' 4"'$  (1.626 m)   Wt 132 lb 3.2 oz (60 kg)   BMI 22.69 kg/m  General:   Alert and oriented. No distress noted. Pleasant and cooperative.  Head:  Normocephalic and atraumatic. Eyes:  Conjuctiva clear without scleral icterus. Mouth:  Oral mucosa pink and moist. Good dentition. No lesions. Heart: Normal rate and rhythm, s1 and s2 heart sounds present.  Lungs: Clear lung sounds in all lobes. Respirations equal and unlabored. Abdomen:  +BS, soft, non-tender and non-distended. No rebound or guarding. No HSM or masses noted. Derm: No palmar erythema or  jaundice Msk:  Symmetrical without gross deformities. Normal posture. Extremities:  Without edema. Neurologic:  Alert and  oriented x4 Psych:  Alert and cooperative. Normal mood and affect.  Invalid input(s): "6 MONTHS"   ASSESSMENT: Jeanette Rasmussen is a 77 y.o. female presenting today for constipation/bloating and GERD.  Constipation: Constipation has been chronic with feels worse over the past few months, recent colonoscopy in November with another GI group revealed a few polyps.  She denies any recent lab work.  She is taking 1 tablespoon of MiraLAX daily with some improvement however he feels more gassy and stools are still somewhat hard with the need to strain to defecate at times.  She also notes some bloating.  Will check TSH and CMP to rule out underlying reversible causes of constipation.  Encouraged her to increase water intake to around 64 ounces per day as well as fruits and veggies.  We discussed other alternatives to MiraLAX however at this time she would like to continue with this and make dietary changes. Will add simethicone q6hr PRN as well. She denies rectal bleeding melena.   GERD: Currently on omeprazole 40 mg once daily and Pepcid 20 mg in the evening.  She notes having GERD symptoms maybe once a week usually at night with acid regurgitation.  She has elevated the head of her bed which has helped some.  We discussed the importance of reflux precautions, to include I have  Avoiding greasy, spicy, fried, citrus foods, and be mindful that caffeine, carbonated drinks, chocolate and alcohol can increase reflux symptoms. Stay upright 2-3 hours after eating, prior to lying down and avoid eating late in the evenings.  Will continue with current PPI regimen and increase Pepcid to 40 mg nightly.  She prefers for her PCP to prescribe this so I will have her double up on the 20 mg of Pepcid she has currently.  She denies dysphagia, odynophagia, nausea, vomiting.   I suspect that  bloating is secondary to her constipation and possible excess flatulence however if the above interventions do not improve her symptoms we may need to discuss upper endoscopy at next visit.   Notably, patient had recent colonoscopy with  [facility] as she was unable to get in with our group sooner, ideally she needs to keep all of her GI care in 1 location for continuity of care. She prefers to have her GI needs managed here with our practice.    PLAN:  TSH, CMP  2.  Increase water intake, aim for 64 oz/day, fruits, veggies  3. Continue omeprazole '40mg'$  daily and increase pepcid to '40mg'$  QSH 4. Continue miralax 1T daily 5. Simethicone q6hr for gas/bloating 6. Strict Reflux precautions  7. Consider EGD if bloating symptoms not improved with above interventions  All questions were answered, patient verbalized understanding and is in agreement with plan as outlined above.   Follow Up: 6-8 weeks  Jevon Shells L. Alver Sorrow, MSN, APRN, AGNP-C Adult-Gerontology Nurse Practitioner [facility]  I have reviewed the note and agree with the APP's assessment as described in this progress note  Maylon Peppers, MD Gastroenterology and Hepatology [facility]

## 2022-04-27 NOTE — Patient Instructions (Addendum)
Please continue omeprazole once daily and increase pepcid to '40mg'$  in the evening  Please take this 30 minutes prior to breakfast Avoid greasy, spicy, fried, citrus foods, and be mindful that caffeine, carbonated drinks, chocolate and alcohol can increase reflux symptoms Stay upright 2-3 hours after eating, prior to lying down and avoid eating late in the evenings.  Increase water intake, aim for atleast 64 oz per day Increase fruits, veggies and whole grains, kiwi and prunes are especially good for constipation Can continue with miralax if this is working, I have sent simethicone to take up to 4x/day for gas/bloating.  Please let me know if you have any new or worsening symptoms in the meantime, if bloating symptoms are not improved, we may discuss upper endoscopy at next visit  Follow up 6-8 weeks

## 2022-04-28 LAB — COMPREHENSIVE METABOLIC PANEL
AG Ratio: 2.1 (calc) (ref 1.0–2.5)
ALT: 9 U/L (ref 6–29)
AST: 11 U/L (ref 10–35)
Albumin: 4 g/dL (ref 3.6–5.1)
Alkaline phosphatase (APISO): 76 U/L (ref 37–153)
BUN/Creatinine Ratio: 26 (calc) — ABNORMAL HIGH (ref 6–22)
BUN: 31 mg/dL — ABNORMAL HIGH (ref 7–25)
CO2: 28 mmol/L (ref 20–32)
Calcium: 9.5 mg/dL (ref 8.6–10.4)
Chloride: 104 mmol/L (ref 98–110)
Creat: 1.17 mg/dL — ABNORMAL HIGH (ref 0.60–1.00)
Globulin: 1.9 g/dL (calc) (ref 1.9–3.7)
Glucose, Bld: 89 mg/dL (ref 65–99)
Potassium: 4.2 mmol/L (ref 3.5–5.3)
Sodium: 141 mmol/L (ref 135–146)
Total Bilirubin: 0.3 mg/dL (ref 0.2–1.2)
Total Protein: 5.9 g/dL — ABNORMAL LOW (ref 6.1–8.1)

## 2022-04-28 LAB — TSH: TSH: 2.36 mIU/L (ref 0.40–4.50)

## 2022-06-29 ENCOUNTER — Ambulatory Visit (INDEPENDENT_AMBULATORY_CARE_PROVIDER_SITE_OTHER): Payer: Medicare Other | Admitting: Gastroenterology

## 2023-12-04 ENCOUNTER — Encounter (INDEPENDENT_AMBULATORY_CARE_PROVIDER_SITE_OTHER): Payer: Self-pay | Admitting: *Deleted
